# Patient Record
Sex: Female | Born: 1945 | Race: White | Hispanic: No | Marital: Married | Smoking: Former smoker
Health system: Southern US, Community
[De-identification: ages and names within clinical notes are randomized; demographics above are authoritative.]

## PROBLEM LIST (undated history)

## (undated) DIAGNOSIS — F419 Anxiety disorder, unspecified: Secondary | ICD-10-CM

## (undated) DIAGNOSIS — E785 Hyperlipidemia, unspecified: Secondary | ICD-10-CM

## (undated) DIAGNOSIS — E039 Hypothyroidism, unspecified: Secondary | ICD-10-CM

## (undated) DIAGNOSIS — J302 Other seasonal allergic rhinitis: Secondary | ICD-10-CM

## (undated) DIAGNOSIS — R002 Palpitations: Secondary | ICD-10-CM

## (undated) DIAGNOSIS — L509 Urticaria, unspecified: Secondary | ICD-10-CM

## (undated) DIAGNOSIS — Z8679 Personal history of other diseases of the circulatory system: Secondary | ICD-10-CM

## (undated) HISTORY — DX: Anxiety disorder, unspecified: F41.9

## (undated) HISTORY — DX: Hypothyroidism, unspecified: E03.9

## (undated) HISTORY — DX: Palpitations: R00.2

## (undated) HISTORY — PX: TUBAL LIGATION: SHX77

## (undated) HISTORY — DX: Urticaria, unspecified: L50.9

## (undated) HISTORY — DX: Hyperlipidemia, unspecified: E78.5

## (undated) HISTORY — PX: BREAST BIOPSY: SHX20

## (undated) HISTORY — PX: OTHER SURGICAL HISTORY: SHX169

## (undated) HISTORY — DX: Other seasonal allergic rhinitis: J30.2

---
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
## 2004-06-06 HISTORY — PX: OTHER SURGICAL HISTORY: SHX169

## 2014-08-28 DIAGNOSIS — E039 Hypothyroidism, unspecified: Secondary | ICD-10-CM | POA: Diagnosis not present

## 2015-03-10 DIAGNOSIS — L821 Other seborrheic keratosis: Secondary | ICD-10-CM | POA: Diagnosis not present

## 2015-03-10 DIAGNOSIS — L814 Other melanin hyperpigmentation: Secondary | ICD-10-CM | POA: Diagnosis not present

## 2015-03-10 DIAGNOSIS — L739 Follicular disorder, unspecified: Secondary | ICD-10-CM | POA: Diagnosis not present

## 2015-03-10 DIAGNOSIS — D1801 Hemangioma of skin and subcutaneous tissue: Secondary | ICD-10-CM | POA: Diagnosis not present

## 2015-05-12 DIAGNOSIS — Z1231 Encounter for screening mammogram for malignant neoplasm of breast: Secondary | ICD-10-CM | POA: Diagnosis not present

## 2015-08-11 DIAGNOSIS — Z01411 Encounter for gynecological examination (general) (routine) with abnormal findings: Secondary | ICD-10-CM | POA: Diagnosis not present

## 2015-08-11 DIAGNOSIS — E039 Hypothyroidism, unspecified: Secondary | ICD-10-CM | POA: Diagnosis not present

## 2015-08-11 DIAGNOSIS — T7840XD Allergy, unspecified, subsequent encounter: Secondary | ICD-10-CM | POA: Diagnosis not present

## 2015-08-11 DIAGNOSIS — Z0001 Encounter for general adult medical examination with abnormal findings: Secondary | ICD-10-CM | POA: Diagnosis not present

## 2015-08-11 DIAGNOSIS — R002 Palpitations: Secondary | ICD-10-CM | POA: Diagnosis not present

## 2015-08-11 DIAGNOSIS — E785 Hyperlipidemia, unspecified: Secondary | ICD-10-CM | POA: Diagnosis not present

## 2015-08-27 DIAGNOSIS — E039 Hypothyroidism, unspecified: Secondary | ICD-10-CM | POA: Diagnosis not present

## 2015-08-27 DIAGNOSIS — M810 Age-related osteoporosis without current pathological fracture: Secondary | ICD-10-CM | POA: Diagnosis not present

## 2015-09-01 DIAGNOSIS — N390 Urinary tract infection, site not specified: Secondary | ICD-10-CM | POA: Diagnosis not present

## 2015-12-04 DIAGNOSIS — R319 Hematuria, unspecified: Secondary | ICD-10-CM | POA: Diagnosis not present

## 2015-12-04 DIAGNOSIS — H9201 Otalgia, right ear: Secondary | ICD-10-CM | POA: Diagnosis not present

## 2015-12-04 DIAGNOSIS — N3000 Acute cystitis without hematuria: Secondary | ICD-10-CM | POA: Diagnosis not present

## 2015-12-21 DIAGNOSIS — Z961 Presence of intraocular lens: Secondary | ICD-10-CM | POA: Diagnosis not present

## 2015-12-21 DIAGNOSIS — H43811 Vitreous degeneration, right eye: Secondary | ICD-10-CM | POA: Diagnosis not present

## 2015-12-21 DIAGNOSIS — H2512 Age-related nuclear cataract, left eye: Secondary | ICD-10-CM | POA: Diagnosis not present

## 2015-12-21 DIAGNOSIS — H524 Presbyopia: Secondary | ICD-10-CM | POA: Diagnosis not present

## 2016-01-21 DIAGNOSIS — N2889 Other specified disorders of kidney and ureter: Secondary | ICD-10-CM | POA: Diagnosis not present

## 2016-01-21 DIAGNOSIS — N390 Urinary tract infection, site not specified: Secondary | ICD-10-CM | POA: Diagnosis not present

## 2016-01-21 DIAGNOSIS — N281 Cyst of kidney, acquired: Secondary | ICD-10-CM | POA: Diagnosis not present

## 2016-01-21 DIAGNOSIS — N200 Calculus of kidney: Secondary | ICD-10-CM | POA: Diagnosis not present

## 2016-02-10 DIAGNOSIS — Z1283 Encounter for screening for malignant neoplasm of skin: Secondary | ICD-10-CM | POA: Diagnosis not present

## 2016-02-10 DIAGNOSIS — D225 Melanocytic nevi of trunk: Secondary | ICD-10-CM | POA: Diagnosis not present

## 2016-02-16 DIAGNOSIS — H6121 Impacted cerumen, right ear: Secondary | ICD-10-CM | POA: Diagnosis not present

## 2016-02-16 DIAGNOSIS — M545 Low back pain: Secondary | ICD-10-CM | POA: Diagnosis not present

## 2016-02-16 DIAGNOSIS — R309 Painful micturition, unspecified: Secondary | ICD-10-CM | POA: Diagnosis not present

## 2016-03-26 DIAGNOSIS — Z23 Encounter for immunization: Secondary | ICD-10-CM | POA: Diagnosis not present

## 2016-05-03 DIAGNOSIS — E039 Hypothyroidism, unspecified: Secondary | ICD-10-CM | POA: Diagnosis not present

## 2016-05-03 DIAGNOSIS — R000 Tachycardia, unspecified: Secondary | ICD-10-CM | POA: Diagnosis not present

## 2016-05-10 DIAGNOSIS — Z79899 Other long term (current) drug therapy: Secondary | ICD-10-CM | POA: Diagnosis not present

## 2016-05-10 DIAGNOSIS — J101 Influenza due to other identified influenza virus with other respiratory manifestations: Secondary | ICD-10-CM | POA: Diagnosis not present

## 2016-05-10 DIAGNOSIS — Z6828 Body mass index (BMI) 28.0-28.9, adult: Secondary | ICD-10-CM | POA: Diagnosis not present

## 2016-05-12 DIAGNOSIS — Z79899 Other long term (current) drug therapy: Secondary | ICD-10-CM | POA: Diagnosis not present

## 2016-05-12 DIAGNOSIS — Z6828 Body mass index (BMI) 28.0-28.9, adult: Secondary | ICD-10-CM | POA: Diagnosis not present

## 2016-05-12 DIAGNOSIS — J101 Influenza due to other identified influenza virus with other respiratory manifestations: Secondary | ICD-10-CM | POA: Diagnosis not present

## 2016-08-02 DIAGNOSIS — Z1231 Encounter for screening mammogram for malignant neoplasm of breast: Secondary | ICD-10-CM | POA: Diagnosis not present

## 2016-08-04 DIAGNOSIS — R000 Tachycardia, unspecified: Secondary | ICD-10-CM | POA: Diagnosis not present

## 2016-08-04 DIAGNOSIS — E782 Mixed hyperlipidemia: Secondary | ICD-10-CM | POA: Diagnosis not present

## 2016-08-04 DIAGNOSIS — R7301 Impaired fasting glucose: Secondary | ICD-10-CM | POA: Diagnosis not present

## 2016-08-04 DIAGNOSIS — E039 Hypothyroidism, unspecified: Secondary | ICD-10-CM | POA: Diagnosis not present

## 2016-08-08 DIAGNOSIS — Z0000 Encounter for general adult medical examination without abnormal findings: Secondary | ICD-10-CM | POA: Diagnosis not present

## 2016-08-08 DIAGNOSIS — E039 Hypothyroidism, unspecified: Secondary | ICD-10-CM | POA: Diagnosis not present

## 2016-08-08 DIAGNOSIS — Z6828 Body mass index (BMI) 28.0-28.9, adult: Secondary | ICD-10-CM | POA: Diagnosis not present

## 2016-08-08 DIAGNOSIS — R000 Tachycardia, unspecified: Secondary | ICD-10-CM | POA: Diagnosis not present

## 2016-08-08 DIAGNOSIS — F411 Generalized anxiety disorder: Secondary | ICD-10-CM | POA: Diagnosis not present

## 2016-08-16 DIAGNOSIS — H43812 Vitreous degeneration, left eye: Secondary | ICD-10-CM | POA: Diagnosis not present

## 2016-12-28 DIAGNOSIS — H43811 Vitreous degeneration, right eye: Secondary | ICD-10-CM | POA: Diagnosis not present

## 2016-12-28 DIAGNOSIS — H524 Presbyopia: Secondary | ICD-10-CM | POA: Diagnosis not present

## 2016-12-28 DIAGNOSIS — H43812 Vitreous degeneration, left eye: Secondary | ICD-10-CM | POA: Diagnosis not present

## 2016-12-28 DIAGNOSIS — Z961 Presence of intraocular lens: Secondary | ICD-10-CM | POA: Diagnosis not present

## 2016-12-28 DIAGNOSIS — H2512 Age-related nuclear cataract, left eye: Secondary | ICD-10-CM | POA: Diagnosis not present

## 2017-02-20 DIAGNOSIS — R002 Palpitations: Secondary | ICD-10-CM | POA: Diagnosis not present

## 2017-02-20 DIAGNOSIS — E039 Hypothyroidism, unspecified: Secondary | ICD-10-CM | POA: Diagnosis not present

## 2017-02-24 DIAGNOSIS — N390 Urinary tract infection, site not specified: Secondary | ICD-10-CM | POA: Diagnosis not present

## 2017-02-24 DIAGNOSIS — N281 Cyst of kidney, acquired: Secondary | ICD-10-CM | POA: Diagnosis not present

## 2017-03-01 DIAGNOSIS — D225 Melanocytic nevi of trunk: Secondary | ICD-10-CM | POA: Diagnosis not present

## 2017-03-01 DIAGNOSIS — Z1283 Encounter for screening for malignant neoplasm of skin: Secondary | ICD-10-CM | POA: Diagnosis not present

## 2017-03-06 DIAGNOSIS — E039 Hypothyroidism, unspecified: Secondary | ICD-10-CM | POA: Diagnosis not present

## 2017-03-08 DIAGNOSIS — R000 Tachycardia, unspecified: Secondary | ICD-10-CM | POA: Diagnosis not present

## 2017-03-08 DIAGNOSIS — E039 Hypothyroidism, unspecified: Secondary | ICD-10-CM | POA: Diagnosis not present

## 2017-03-08 DIAGNOSIS — Z23 Encounter for immunization: Secondary | ICD-10-CM | POA: Diagnosis not present

## 2017-03-08 DIAGNOSIS — Z6828 Body mass index (BMI) 28.0-28.9, adult: Secondary | ICD-10-CM | POA: Diagnosis not present

## 2017-03-08 DIAGNOSIS — Z01419 Encounter for gynecological examination (general) (routine) without abnormal findings: Secondary | ICD-10-CM | POA: Diagnosis not present

## 2017-03-08 DIAGNOSIS — F411 Generalized anxiety disorder: Secondary | ICD-10-CM | POA: Diagnosis not present

## 2017-05-24 DIAGNOSIS — J069 Acute upper respiratory infection, unspecified: Secondary | ICD-10-CM | POA: Diagnosis not present

## 2017-06-09 DIAGNOSIS — R1013 Epigastric pain: Secondary | ICD-10-CM | POA: Diagnosis not present

## 2017-08-28 DIAGNOSIS — H903 Sensorineural hearing loss, bilateral: Secondary | ICD-10-CM | POA: Diagnosis not present

## 2017-08-28 DIAGNOSIS — H9313 Tinnitus, bilateral: Secondary | ICD-10-CM | POA: Diagnosis not present

## 2017-08-28 DIAGNOSIS — H6123 Impacted cerumen, bilateral: Secondary | ICD-10-CM | POA: Diagnosis not present

## 2017-08-29 DIAGNOSIS — M1811 Unilateral primary osteoarthritis of first carpometacarpal joint, right hand: Secondary | ICD-10-CM | POA: Diagnosis not present

## 2017-08-29 DIAGNOSIS — M1812 Unilateral primary osteoarthritis of first carpometacarpal joint, left hand: Secondary | ICD-10-CM | POA: Diagnosis not present

## 2017-09-05 DIAGNOSIS — I10 Essential (primary) hypertension: Secondary | ICD-10-CM | POA: Diagnosis not present

## 2017-09-05 DIAGNOSIS — E039 Hypothyroidism, unspecified: Secondary | ICD-10-CM | POA: Diagnosis not present

## 2017-09-08 DIAGNOSIS — E039 Hypothyroidism, unspecified: Secondary | ICD-10-CM | POA: Diagnosis not present

## 2017-09-08 DIAGNOSIS — R000 Tachycardia, unspecified: Secondary | ICD-10-CM | POA: Diagnosis not present

## 2017-09-08 DIAGNOSIS — F419 Anxiety disorder, unspecified: Secondary | ICD-10-CM | POA: Diagnosis not present

## 2017-09-08 DIAGNOSIS — Z0000 Encounter for general adult medical examination without abnormal findings: Secondary | ICD-10-CM | POA: Diagnosis not present

## 2017-09-12 ENCOUNTER — Other Ambulatory Visit (HOSPITAL_COMMUNITY): Payer: Self-pay | Admitting: Internal Medicine

## 2017-09-12 DIAGNOSIS — Z1231 Encounter for screening mammogram for malignant neoplasm of breast: Secondary | ICD-10-CM

## 2017-09-14 ENCOUNTER — Ambulatory Visit (HOSPITAL_COMMUNITY): Payer: Self-pay

## 2017-09-27 DIAGNOSIS — Z1231 Encounter for screening mammogram for malignant neoplasm of breast: Secondary | ICD-10-CM | POA: Diagnosis not present

## 2017-10-02 ENCOUNTER — Ambulatory Visit (HOSPITAL_COMMUNITY): Payer: Self-pay

## 2017-10-17 DIAGNOSIS — M1811 Unilateral primary osteoarthritis of first carpometacarpal joint, right hand: Secondary | ICD-10-CM | POA: Diagnosis not present

## 2017-10-17 DIAGNOSIS — M1812 Unilateral primary osteoarthritis of first carpometacarpal joint, left hand: Secondary | ICD-10-CM | POA: Diagnosis not present

## 2017-11-27 DIAGNOSIS — M1811 Unilateral primary osteoarthritis of first carpometacarpal joint, right hand: Secondary | ICD-10-CM | POA: Diagnosis not present

## 2017-11-27 DIAGNOSIS — M1812 Unilateral primary osteoarthritis of first carpometacarpal joint, left hand: Secondary | ICD-10-CM | POA: Diagnosis not present

## 2017-12-27 DIAGNOSIS — H43811 Vitreous degeneration, right eye: Secondary | ICD-10-CM | POA: Diagnosis not present

## 2017-12-27 DIAGNOSIS — H524 Presbyopia: Secondary | ICD-10-CM | POA: Diagnosis not present

## 2017-12-27 DIAGNOSIS — H2512 Age-related nuclear cataract, left eye: Secondary | ICD-10-CM | POA: Diagnosis not present

## 2017-12-27 DIAGNOSIS — H43812 Vitreous degeneration, left eye: Secondary | ICD-10-CM | POA: Diagnosis not present

## 2017-12-27 DIAGNOSIS — Z961 Presence of intraocular lens: Secondary | ICD-10-CM | POA: Diagnosis not present

## 2018-01-30 DIAGNOSIS — H903 Sensorineural hearing loss, bilateral: Secondary | ICD-10-CM | POA: Diagnosis not present

## 2018-01-30 DIAGNOSIS — H9313 Tinnitus, bilateral: Secondary | ICD-10-CM | POA: Diagnosis not present

## 2018-01-30 DIAGNOSIS — H6123 Impacted cerumen, bilateral: Secondary | ICD-10-CM | POA: Diagnosis not present

## 2018-03-14 DIAGNOSIS — E039 Hypothyroidism, unspecified: Secondary | ICD-10-CM | POA: Diagnosis not present

## 2018-03-14 DIAGNOSIS — Z23 Encounter for immunization: Secondary | ICD-10-CM | POA: Diagnosis not present

## 2018-03-14 DIAGNOSIS — R000 Tachycardia, unspecified: Secondary | ICD-10-CM | POA: Diagnosis not present

## 2018-03-16 DIAGNOSIS — R000 Tachycardia, unspecified: Secondary | ICD-10-CM | POA: Diagnosis not present

## 2018-03-16 DIAGNOSIS — E039 Hypothyroidism, unspecified: Secondary | ICD-10-CM | POA: Diagnosis not present

## 2018-03-16 DIAGNOSIS — E782 Mixed hyperlipidemia: Secondary | ICD-10-CM | POA: Diagnosis not present

## 2018-03-16 DIAGNOSIS — Z6828 Body mass index (BMI) 28.0-28.9, adult: Secondary | ICD-10-CM | POA: Diagnosis not present

## 2018-03-16 DIAGNOSIS — F419 Anxiety disorder, unspecified: Secondary | ICD-10-CM | POA: Diagnosis not present

## 2018-05-09 DIAGNOSIS — Z1283 Encounter for screening for malignant neoplasm of skin: Secondary | ICD-10-CM | POA: Diagnosis not present

## 2018-05-09 DIAGNOSIS — D225 Melanocytic nevi of trunk: Secondary | ICD-10-CM | POA: Diagnosis not present

## 2018-05-14 DIAGNOSIS — M8589 Other specified disorders of bone density and structure, multiple sites: Secondary | ICD-10-CM | POA: Diagnosis not present

## 2018-05-14 DIAGNOSIS — E890 Postprocedural hypothyroidism: Secondary | ICD-10-CM | POA: Diagnosis not present

## 2018-06-26 DIAGNOSIS — H6123 Impacted cerumen, bilateral: Secondary | ICD-10-CM | POA: Diagnosis not present

## 2018-07-20 DIAGNOSIS — R197 Diarrhea, unspecified: Secondary | ICD-10-CM | POA: Diagnosis not present

## 2018-07-20 DIAGNOSIS — R002 Palpitations: Secondary | ICD-10-CM | POA: Diagnosis not present

## 2018-07-20 DIAGNOSIS — R079 Chest pain, unspecified: Secondary | ICD-10-CM | POA: Diagnosis not present

## 2018-07-20 DIAGNOSIS — I472 Ventricular tachycardia: Secondary | ICD-10-CM | POA: Diagnosis not present

## 2018-07-20 DIAGNOSIS — Z87891 Personal history of nicotine dependence: Secondary | ICD-10-CM | POA: Diagnosis not present

## 2018-07-20 DIAGNOSIS — I491 Atrial premature depolarization: Secondary | ICD-10-CM | POA: Diagnosis not present

## 2018-08-10 DIAGNOSIS — R002 Palpitations: Secondary | ICD-10-CM | POA: Diagnosis not present

## 2018-08-10 DIAGNOSIS — G43109 Migraine with aura, not intractable, without status migrainosus: Secondary | ICD-10-CM | POA: Diagnosis not present

## 2018-08-10 DIAGNOSIS — I491 Atrial premature depolarization: Secondary | ICD-10-CM | POA: Diagnosis not present

## 2018-08-17 DIAGNOSIS — E890 Postprocedural hypothyroidism: Secondary | ICD-10-CM | POA: Diagnosis not present

## 2018-08-17 DIAGNOSIS — M8589 Other specified disorders of bone density and structure, multiple sites: Secondary | ICD-10-CM | POA: Diagnosis not present

## 2018-08-17 DIAGNOSIS — M8580 Other specified disorders of bone density and structure, unspecified site: Secondary | ICD-10-CM | POA: Diagnosis not present

## 2018-09-17 DIAGNOSIS — R1013 Epigastric pain: Secondary | ICD-10-CM | POA: Diagnosis not present

## 2018-09-17 DIAGNOSIS — F419 Anxiety disorder, unspecified: Secondary | ICD-10-CM | POA: Diagnosis not present

## 2018-09-17 DIAGNOSIS — R000 Tachycardia, unspecified: Secondary | ICD-10-CM | POA: Diagnosis not present

## 2018-09-17 DIAGNOSIS — E039 Hypothyroidism, unspecified: Secondary | ICD-10-CM | POA: Diagnosis not present

## 2018-09-17 DIAGNOSIS — E782 Mixed hyperlipidemia: Secondary | ICD-10-CM | POA: Diagnosis not present

## 2018-09-19 DIAGNOSIS — R000 Tachycardia, unspecified: Secondary | ICD-10-CM | POA: Diagnosis not present

## 2018-09-19 DIAGNOSIS — F411 Generalized anxiety disorder: Secondary | ICD-10-CM | POA: Diagnosis not present

## 2018-09-19 DIAGNOSIS — E785 Hyperlipidemia, unspecified: Secondary | ICD-10-CM | POA: Diagnosis not present

## 2018-09-19 DIAGNOSIS — E039 Hypothyroidism, unspecified: Secondary | ICD-10-CM | POA: Diagnosis not present

## 2018-10-24 DIAGNOSIS — Z0000 Encounter for general adult medical examination without abnormal findings: Secondary | ICD-10-CM | POA: Diagnosis not present

## 2018-11-23 DIAGNOSIS — R002 Palpitations: Secondary | ICD-10-CM | POA: Diagnosis not present

## 2018-11-23 DIAGNOSIS — I491 Atrial premature depolarization: Secondary | ICD-10-CM | POA: Diagnosis not present

## 2018-11-23 DIAGNOSIS — I319 Disease of pericardium, unspecified: Secondary | ICD-10-CM | POA: Diagnosis not present

## 2018-11-23 DIAGNOSIS — I313 Pericardial effusion (noninflammatory): Secondary | ICD-10-CM | POA: Diagnosis not present

## 2018-11-23 DIAGNOSIS — Z1231 Encounter for screening mammogram for malignant neoplasm of breast: Secondary | ICD-10-CM | POA: Diagnosis not present

## 2018-11-23 DIAGNOSIS — I350 Nonrheumatic aortic (valve) stenosis: Secondary | ICD-10-CM | POA: Diagnosis not present

## 2018-11-23 DIAGNOSIS — I517 Cardiomegaly: Secondary | ICD-10-CM | POA: Diagnosis not present

## 2018-11-23 DIAGNOSIS — I080 Rheumatic disorders of both mitral and aortic valves: Secondary | ICD-10-CM | POA: Diagnosis not present

## 2018-11-23 DIAGNOSIS — I071 Rheumatic tricuspid insufficiency: Secondary | ICD-10-CM | POA: Diagnosis not present

## 2019-01-23 DIAGNOSIS — H43811 Vitreous degeneration, right eye: Secondary | ICD-10-CM | POA: Diagnosis not present

## 2019-01-23 DIAGNOSIS — Z961 Presence of intraocular lens: Secondary | ICD-10-CM | POA: Diagnosis not present

## 2019-01-23 DIAGNOSIS — H2512 Age-related nuclear cataract, left eye: Secondary | ICD-10-CM | POA: Diagnosis not present

## 2019-01-23 DIAGNOSIS — H524 Presbyopia: Secondary | ICD-10-CM | POA: Diagnosis not present

## 2019-01-23 DIAGNOSIS — G43109 Migraine with aura, not intractable, without status migrainosus: Secondary | ICD-10-CM | POA: Diagnosis not present

## 2019-01-23 DIAGNOSIS — H43812 Vitreous degeneration, left eye: Secondary | ICD-10-CM | POA: Diagnosis not present

## 2019-01-24 DIAGNOSIS — H6123 Impacted cerumen, bilateral: Secondary | ICD-10-CM | POA: Diagnosis not present

## 2019-01-24 DIAGNOSIS — H903 Sensorineural hearing loss, bilateral: Secondary | ICD-10-CM | POA: Diagnosis not present

## 2019-02-01 DIAGNOSIS — F411 Generalized anxiety disorder: Secondary | ICD-10-CM | POA: Diagnosis not present

## 2019-02-01 DIAGNOSIS — E785 Hyperlipidemia, unspecified: Secondary | ICD-10-CM | POA: Diagnosis not present

## 2019-02-01 DIAGNOSIS — E039 Hypothyroidism, unspecified: Secondary | ICD-10-CM | POA: Diagnosis not present

## 2019-02-01 DIAGNOSIS — R000 Tachycardia, unspecified: Secondary | ICD-10-CM | POA: Diagnosis not present

## 2019-02-12 DIAGNOSIS — E890 Postprocedural hypothyroidism: Secondary | ICD-10-CM | POA: Diagnosis not present

## 2019-02-12 DIAGNOSIS — M8588 Other specified disorders of bone density and structure, other site: Secondary | ICD-10-CM | POA: Diagnosis not present

## 2019-02-12 DIAGNOSIS — M8589 Other specified disorders of bone density and structure, multiple sites: Secondary | ICD-10-CM | POA: Diagnosis not present

## 2019-02-12 DIAGNOSIS — M85852 Other specified disorders of bone density and structure, left thigh: Secondary | ICD-10-CM | POA: Diagnosis not present

## 2019-02-14 DIAGNOSIS — Z23 Encounter for immunization: Secondary | ICD-10-CM | POA: Diagnosis not present

## 2019-02-18 DIAGNOSIS — M8589 Other specified disorders of bone density and structure, multiple sites: Secondary | ICD-10-CM | POA: Diagnosis not present

## 2019-02-18 DIAGNOSIS — E890 Postprocedural hypothyroidism: Secondary | ICD-10-CM | POA: Diagnosis not present

## 2019-03-20 DIAGNOSIS — F411 Generalized anxiety disorder: Secondary | ICD-10-CM | POA: Diagnosis not present

## 2019-03-20 DIAGNOSIS — E785 Hyperlipidemia, unspecified: Secondary | ICD-10-CM | POA: Diagnosis not present

## 2019-03-20 DIAGNOSIS — R000 Tachycardia, unspecified: Secondary | ICD-10-CM | POA: Diagnosis not present

## 2019-03-20 DIAGNOSIS — E039 Hypothyroidism, unspecified: Secondary | ICD-10-CM | POA: Diagnosis not present

## 2019-04-15 DIAGNOSIS — E039 Hypothyroidism, unspecified: Secondary | ICD-10-CM | POA: Diagnosis not present

## 2019-04-15 DIAGNOSIS — E785 Hyperlipidemia, unspecified: Secondary | ICD-10-CM | POA: Diagnosis not present

## 2019-04-19 DIAGNOSIS — E785 Hyperlipidemia, unspecified: Secondary | ICD-10-CM | POA: Diagnosis not present

## 2019-04-19 DIAGNOSIS — Z6828 Body mass index (BMI) 28.0-28.9, adult: Secondary | ICD-10-CM | POA: Diagnosis not present

## 2019-04-19 DIAGNOSIS — E039 Hypothyroidism, unspecified: Secondary | ICD-10-CM | POA: Diagnosis not present

## 2019-04-19 DIAGNOSIS — Z0000 Encounter for general adult medical examination without abnormal findings: Secondary | ICD-10-CM | POA: Diagnosis not present

## 2019-04-19 DIAGNOSIS — F411 Generalized anxiety disorder: Secondary | ICD-10-CM | POA: Diagnosis not present

## 2019-04-19 DIAGNOSIS — R1013 Epigastric pain: Secondary | ICD-10-CM | POA: Diagnosis not present

## 2019-04-19 DIAGNOSIS — R000 Tachycardia, unspecified: Secondary | ICD-10-CM | POA: Diagnosis not present

## 2019-04-19 DIAGNOSIS — E782 Mixed hyperlipidemia: Secondary | ICD-10-CM | POA: Diagnosis not present

## 2019-04-19 DIAGNOSIS — F419 Anxiety disorder, unspecified: Secondary | ICD-10-CM | POA: Diagnosis not present

## 2019-04-19 DIAGNOSIS — E663 Overweight: Secondary | ICD-10-CM | POA: Diagnosis not present

## 2019-04-22 DIAGNOSIS — H43393 Other vitreous opacities, bilateral: Secondary | ICD-10-CM | POA: Diagnosis not present

## 2019-04-22 DIAGNOSIS — H04123 Dry eye syndrome of bilateral lacrimal glands: Secondary | ICD-10-CM | POA: Diagnosis not present

## 2019-04-22 DIAGNOSIS — H2512 Age-related nuclear cataract, left eye: Secondary | ICD-10-CM | POA: Diagnosis not present

## 2019-04-22 DIAGNOSIS — H25012 Cortical age-related cataract, left eye: Secondary | ICD-10-CM | POA: Diagnosis not present

## 2019-05-01 DIAGNOSIS — Z0000 Encounter for general adult medical examination without abnormal findings: Secondary | ICD-10-CM | POA: Diagnosis not present

## 2019-05-01 DIAGNOSIS — R1013 Epigastric pain: Secondary | ICD-10-CM | POA: Diagnosis not present

## 2019-05-01 DIAGNOSIS — E039 Hypothyroidism, unspecified: Secondary | ICD-10-CM | POA: Diagnosis not present

## 2019-05-01 DIAGNOSIS — F419 Anxiety disorder, unspecified: Secondary | ICD-10-CM | POA: Diagnosis not present

## 2019-05-01 DIAGNOSIS — R000 Tachycardia, unspecified: Secondary | ICD-10-CM | POA: Diagnosis not present

## 2019-05-16 ENCOUNTER — Other Ambulatory Visit: Payer: Self-pay

## 2019-05-16 ENCOUNTER — Telehealth: Payer: Self-pay | Admitting: *Deleted

## 2019-05-16 DIAGNOSIS — Z20828 Contact with and (suspected) exposure to other viral communicable diseases: Secondary | ICD-10-CM | POA: Diagnosis not present

## 2019-05-16 DIAGNOSIS — Z20822 Contact with and (suspected) exposure to covid-19: Secondary | ICD-10-CM

## 2019-05-16 NOTE — Telephone Encounter (Signed)
Pt called to check on her lab results for covid-19. No results yet. She voiced understanding.

## 2019-05-17 LAB — NOVEL CORONAVIRUS, NAA: SARS-CoV-2, NAA: NOT DETECTED

## 2019-06-12 DIAGNOSIS — M5033 Other cervical disc degeneration, cervicothoracic region: Secondary | ICD-10-CM | POA: Diagnosis not present

## 2019-06-12 DIAGNOSIS — M9901 Segmental and somatic dysfunction of cervical region: Secondary | ICD-10-CM | POA: Diagnosis not present

## 2019-06-13 DIAGNOSIS — M9901 Segmental and somatic dysfunction of cervical region: Secondary | ICD-10-CM | POA: Diagnosis not present

## 2019-06-13 DIAGNOSIS — M5033 Other cervical disc degeneration, cervicothoracic region: Secondary | ICD-10-CM | POA: Diagnosis not present

## 2019-06-17 DIAGNOSIS — M9901 Segmental and somatic dysfunction of cervical region: Secondary | ICD-10-CM | POA: Diagnosis not present

## 2019-06-17 DIAGNOSIS — M5033 Other cervical disc degeneration, cervicothoracic region: Secondary | ICD-10-CM | POA: Diagnosis not present

## 2019-06-18 DIAGNOSIS — H25812 Combined forms of age-related cataract, left eye: Secondary | ICD-10-CM | POA: Diagnosis not present

## 2019-06-18 DIAGNOSIS — H25012 Cortical age-related cataract, left eye: Secondary | ICD-10-CM | POA: Diagnosis not present

## 2019-06-18 DIAGNOSIS — H2512 Age-related nuclear cataract, left eye: Secondary | ICD-10-CM | POA: Diagnosis not present

## 2019-06-29 DIAGNOSIS — Z23 Encounter for immunization: Secondary | ICD-10-CM | POA: Diagnosis not present

## 2019-07-29 DIAGNOSIS — Z23 Encounter for immunization: Secondary | ICD-10-CM | POA: Diagnosis not present

## 2019-08-01 DIAGNOSIS — E039 Hypothyroidism, unspecified: Secondary | ICD-10-CM | POA: Diagnosis not present

## 2019-08-01 DIAGNOSIS — F411 Generalized anxiety disorder: Secondary | ICD-10-CM | POA: Diagnosis not present

## 2019-08-01 DIAGNOSIS — R000 Tachycardia, unspecified: Secondary | ICD-10-CM | POA: Diagnosis not present

## 2019-08-01 DIAGNOSIS — E785 Hyperlipidemia, unspecified: Secondary | ICD-10-CM | POA: Diagnosis not present

## 2019-08-13 DIAGNOSIS — H61301 Acquired stenosis of right external ear canal, unspecified: Secondary | ICD-10-CM | POA: Diagnosis not present

## 2019-08-13 DIAGNOSIS — Z974 Presence of external hearing-aid: Secondary | ICD-10-CM | POA: Diagnosis not present

## 2019-08-13 DIAGNOSIS — H6121 Impacted cerumen, right ear: Secondary | ICD-10-CM | POA: Diagnosis not present

## 2019-08-13 DIAGNOSIS — H903 Sensorineural hearing loss, bilateral: Secondary | ICD-10-CM | POA: Diagnosis not present

## 2019-08-23 ENCOUNTER — Encounter: Payer: Self-pay | Admitting: *Deleted

## 2019-08-26 ENCOUNTER — Encounter: Payer: Self-pay | Admitting: Cardiovascular Disease

## 2019-08-26 ENCOUNTER — Ambulatory Visit (INDEPENDENT_AMBULATORY_CARE_PROVIDER_SITE_OTHER): Payer: Medicare Other | Admitting: Cardiovascular Disease

## 2019-08-26 ENCOUNTER — Other Ambulatory Visit: Payer: Self-pay

## 2019-08-26 VITALS — BP 142/78 | HR 69 | Ht 67.0 in | Wt 184.0 lb

## 2019-08-26 DIAGNOSIS — Z136 Encounter for screening for cardiovascular disorders: Secondary | ICD-10-CM

## 2019-08-26 DIAGNOSIS — R002 Palpitations: Secondary | ICD-10-CM

## 2019-08-26 NOTE — Progress Notes (Addendum)
CARDIOLOGY CONSULT NOTE  Patient ID: Kathy Stephenson MRN: [id number] DOB/AGE: 1946-01-03 74 y.o.  Admit date: (Not on file) Primary Physician: Celene Squibb, MD  Reason for Consultation: Cardiac evaluation  HPI: Kathy Stephenson is a 74 y.o. female who is being seen today for a cardiovascular evaluation at the request of Celene Squibb, MD.   Past medical history includes palpitations for which she takes low-dose atenolol.  ECG performed in the office today which I ordered and personally interpreted demonstrates normal sinus rhythm with no ischemic ST segment or T-wave abnormalities, nor any arrhythmias.  The patient denies any symptoms of chest pain, palpitations, shortness of breath, lightheadedness, dizziness, leg swelling, orthopnea, PND, and syncope.  She has worked for the same [employer] for over 33 years.  She works in [place] and in [place] but ever since the [place] pandemic she has primarily been working out of her home in [place].  She underwent an echocardiogram a year ago in [place] and was told it was normal.  She brought in labs which I reviewed dated 04/15/2019: White blood cell 7.6, hemoglobin 13.6, platelets 283, BUN 14, creatinine 0.83, TSH 0.58, total cholesterol 245, triglycerides 111, HDL 68, LDL 158.  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    Allergies  Allergen Reactions  . Nitrofurantoin   . Other     HONEY - SCRATCHY THROAT  . Penicillins   . Sulfa Antibiotics     Current Outpatient Medications  Medication Sig Dispense Refill  . ALPRAZolam (XANAX) 0.5 MG tablet Take 0.5 mg by mouth at bedtime as needed for anxiety.    Marland Kitchen atenolol (TENORMIN) 25 MG tablet Take 12.5 mg by mouth daily.    Marland Kitchen  levothyroxine (SYNTHROID) 88 MCG tablet Take 88 mcg by mouth daily before breakfast.    . loratadine (CLARITIN) 10 MG tablet Take 10 mg by mouth daily.     No current facility-administered medications for this visit.    Past Medical History:  Diagnosis Date  . Anxiety   . H/O sinus tachycardia   . Hyperlipidemia   . Hypothyroidism   . Palpitations   . Seasonal allergies     Past Surgical History:  Procedure Laterality Date  . BREAST BIOPSY    . OTHER SURGICAL HISTORY     LEFT THORACOTOMY/BTL MENSICUS TEAR REPAIRS     Social History   Socioeconomic History  . Marital status: Married    Spouse name: Not on file  . Number of children: Not on file  . Years of education: Not on file  . Highest education level: Not on file  Occupational History  . Not on file  Tobacco Use  . Smoking status: Former Smoker    Types: Cigarettes  . Smokeless tobacco: Never Used  Substance and Sexual Activity  . Alcohol use: Not on file  . Drug use: Not on file  . Sexual activity: Not on file  Other Topics Concern  . Not on file  Social History Narrative  . Not on file   Social Determinants of Health   Financial Resource Strain:   . Difficulty of Paying Living Expenses:   Food Insecurity:   . Worried About Estate manager/land agent  of Food in the Last Year:   . [place] in the Last Year:   Transportation Needs:   . Lack of Transportation (Medical):   Marland Kitchen Lack of Transportation (Non-Medical):   Physical Activity:   . Days of Exercise per Week:   . Minutes of Exercise per Session:   Stress:   . Feeling of Stress :   Social Connections:   . Frequency of Communication with Friends and Family:   . Frequency of Social Gatherings with Friends and Family:   . Attends Religious Services:   . Active Member of Clubs or Organizations:   . Attends Archivist Meetings:   Marland Kitchen Marital Status:   Intimate Partner Violence:   . Fear of Current or Ex-Partner:   . Emotionally Abused:   Marland Kitchen  Physically Abused:   . Sexually Abused:      No family history of premature CAD in 1st degree relatives.  Current Meds  Medication Sig  . ALPRAZolam (XANAX) 0.5 MG tablet Take 0.5 mg by mouth at bedtime as needed for anxiety.  Marland Kitchen atenolol (TENORMIN) 25 MG tablet Take 12.5 mg by mouth daily.  Marland Kitchen levothyroxine (SYNTHROID) 88 MCG tablet Take 88 mcg by mouth daily before breakfast.  . loratadine (CLARITIN) 10 MG tablet Take 10 mg by mouth daily.      Review of systems complete and found to be negative unless listed above in HPI   [facility], LPN was present throughout the entirety of the encounter.  Physical exam Blood pressure (!) 142/78, pulse 69, height 5\' 7"  (1.702 m), weight 184 lb (83.5 kg), SpO2 98 %. General: NAD Neck: No JVD, no thyromegaly or thyroid nodule.  Lungs: Clear to auscultation bilaterally with normal respiratory effort. CV: Nondisplaced PMI. Regular rate and rhythm, normal S1/S2, no S3/S4, no murmur.  No peripheral edema.  No carotid bruit.    Abdomen: Soft, nontender, no distention.  Skin: Intact without lesions or rashes.  Neurologic: Alert and oriented x 3.  Psych: Normal affect. Extremities: No clubbing or cyanosis.  HEENT: Normal.   ECG: Most recent ECG reviewed.   Labs: No results found for: K, BUN, CREATININE, ALT, TSH, HGB   Lipids: No results found for: LDLCALC, LDLDIRECT, CHOL, TRIG, HDL      ASSESSMENT AND PLAN:   1.  Cardiovascular screening: She has elevated total cholesterol and LDL as noted above.  She is asymptomatic.  She requests a coronary artery calcium score.  I will arrange for this.  If it is significantly elevated, I would start statin therapy.  I will also check a C-reactive protein for risk stratification purposes.  2.  Palpitations: Symptomatically stable on atenolol 12.5 mg daily.  No changes.    Disposition: Follow up in 3 months virtual visit  Signed: Kate Sable, M.D., F.A.C.C.  08/26/2019, 3:18 PM

## 2019-08-26 NOTE — Patient Instructions (Addendum)
Medication Instructions:  Continue all current medications.  Labwork: CRP - order given today.   Testing/Procedures: Screening coronary calcium score  Follow-Up:  Office will contact with results via phone or letter.    3 months   Any Other Special Instructions Will Be Listed Below (If Applicable).  If you need a refill on your cardiac medications before your next appointment, please call your pharmacy.

## 2019-09-03 ENCOUNTER — Other Ambulatory Visit: Payer: Self-pay

## 2019-09-03 ENCOUNTER — Inpatient Hospital Stay: Admission: RE | Admit: 2019-09-03 | Payer: Medicare Other | Source: Ambulatory Visit

## 2019-09-03 ENCOUNTER — Ambulatory Visit (INDEPENDENT_AMBULATORY_CARE_PROVIDER_SITE_OTHER)
Admission: RE | Admit: 2019-09-03 | Discharge: 2019-09-03 | Disposition: A | Discharge status: Home / Self Care | Payer: Self-pay | Source: Ambulatory Visit | Attending: Cardiovascular Disease | Admitting: Cardiovascular Disease

## 2019-09-03 DIAGNOSIS — R002 Palpitations: Secondary | ICD-10-CM

## 2019-09-03 DIAGNOSIS — R918 Other nonspecific abnormal finding of lung field: Secondary | ICD-10-CM | POA: Diagnosis not present

## 2019-09-03 DIAGNOSIS — J984 Other disorders of lung: Secondary | ICD-10-CM | POA: Diagnosis not present

## 2019-09-03 DIAGNOSIS — Z136 Encounter for screening for cardiovascular disorders: Secondary | ICD-10-CM

## 2019-09-04 ENCOUNTER — Other Ambulatory Visit: Payer: Self-pay | Admitting: Cardiovascular Disease

## 2019-09-04 DIAGNOSIS — Z136 Encounter for screening for cardiovascular disorders: Secondary | ICD-10-CM | POA: Diagnosis not present

## 2019-09-04 DIAGNOSIS — R002 Palpitations: Secondary | ICD-10-CM | POA: Diagnosis not present

## 2019-09-05 ENCOUNTER — Telehealth: Payer: Self-pay | Admitting: *Deleted

## 2019-09-05 LAB — C-REACTIVE PROTEIN: CRP: 2.1 mg/L (ref <8.0)

## 2019-09-05 NOTE — Telephone Encounter (Signed)
-----   Message from Herminio Commons, MD sent at 09/04/2019  8:33 AM EDT ----- Coronary calcium score of 78. This was 64th percentile for age and sex matched control.  Continue to pursue a lifestyle which promotes the primary prevention of cardiovascular disease.  Medical therapy is not warranted at this time.

## 2019-09-05 NOTE — Telephone Encounter (Signed)
CRP -   Kathy Commons, MD  09/05/2019 9:06 AM EDT    CRP is normal. Overall cardiac risk is low.

## 2019-09-05 NOTE — Telephone Encounter (Signed)
Left message to return call 

## 2019-09-09 NOTE — Telephone Encounter (Signed)
Patient notified.  Copy to pmd.  Follow up scheduled for June with Dr. Bronson Ing.

## 2019-09-26 DIAGNOSIS — E039 Hypothyroidism, unspecified: Secondary | ICD-10-CM | POA: Diagnosis not present

## 2019-09-26 DIAGNOSIS — R000 Tachycardia, unspecified: Secondary | ICD-10-CM | POA: Diagnosis not present

## 2019-09-26 DIAGNOSIS — E785 Hyperlipidemia, unspecified: Secondary | ICD-10-CM | POA: Diagnosis not present

## 2019-09-26 DIAGNOSIS — F411 Generalized anxiety disorder: Secondary | ICD-10-CM | POA: Diagnosis not present

## 2019-10-07 DIAGNOSIS — E782 Mixed hyperlipidemia: Secondary | ICD-10-CM | POA: Diagnosis not present

## 2019-10-07 DIAGNOSIS — R000 Tachycardia, unspecified: Secondary | ICD-10-CM | POA: Diagnosis not present

## 2019-10-07 DIAGNOSIS — F419 Anxiety disorder, unspecified: Secondary | ICD-10-CM | POA: Diagnosis not present

## 2019-10-07 DIAGNOSIS — E785 Hyperlipidemia, unspecified: Secondary | ICD-10-CM | POA: Diagnosis not present

## 2019-10-07 DIAGNOSIS — E039 Hypothyroidism, unspecified: Secondary | ICD-10-CM | POA: Diagnosis not present

## 2019-10-07 DIAGNOSIS — E663 Overweight: Secondary | ICD-10-CM | POA: Diagnosis not present

## 2019-10-07 DIAGNOSIS — R1013 Epigastric pain: Secondary | ICD-10-CM | POA: Diagnosis not present

## 2019-10-11 DIAGNOSIS — Z6828 Body mass index (BMI) 28.0-28.9, adult: Secondary | ICD-10-CM | POA: Diagnosis not present

## 2019-10-11 DIAGNOSIS — Z0001 Encounter for general adult medical examination with abnormal findings: Secondary | ICD-10-CM | POA: Diagnosis not present

## 2019-10-11 DIAGNOSIS — R000 Tachycardia, unspecified: Secondary | ICD-10-CM | POA: Diagnosis not present

## 2019-10-11 DIAGNOSIS — L249 Irritant contact dermatitis, unspecified cause: Secondary | ICD-10-CM | POA: Diagnosis not present

## 2019-10-11 DIAGNOSIS — E663 Overweight: Secondary | ICD-10-CM | POA: Diagnosis not present

## 2019-10-11 DIAGNOSIS — F411 Generalized anxiety disorder: Secondary | ICD-10-CM | POA: Diagnosis not present

## 2019-10-11 DIAGNOSIS — E782 Mixed hyperlipidemia: Secondary | ICD-10-CM | POA: Diagnosis not present

## 2019-10-11 DIAGNOSIS — E039 Hypothyroidism, unspecified: Secondary | ICD-10-CM | POA: Diagnosis not present

## 2019-12-02 ENCOUNTER — Telehealth: Payer: Medicare Other | Admitting: Cardiovascular Disease

## 2019-12-10 ENCOUNTER — Encounter: Payer: Self-pay | Admitting: *Deleted

## 2019-12-18 ENCOUNTER — Telehealth: Payer: Medicare Other | Admitting: Cardiovascular Disease

## 2019-12-24 ENCOUNTER — Telehealth: Payer: Self-pay | Admitting: Cardiology

## 2019-12-24 NOTE — Telephone Encounter (Signed)
  Patient Consent for Virtual Visit         Kathy Stephenson has provided verbal consent on 12/24/2019 for a virtual visit (video or telephone).   CONSENT FOR VIRTUAL VISIT FOR:  Kathy Stephenson  By participating in this virtual visit I agree to the following:  I hereby voluntarily request, consent and authorize [facility] and its employed or contracted physicians, physician assistants, nurse practitioners or other licensed health care professionals (the Practitioner), to provide me with telemedicine health care services (the "Services") as deemed necessary by the treating Practitioner. I acknowledge and consent to receive the Services by the Practitioner via telemedicine. I understand that the telemedicine visit will involve communicating with the Practitioner through live audiovisual communication technology and the disclosure of certain medical information by electronic transmission. I acknowledge that I have been given the opportunity to request an in-person assessment or other available alternative prior to the telemedicine visit and am voluntarily participating in the telemedicine visit.  I understand that I have the right to withhold or withdraw my consent to the use of telemedicine in the course of my care at any time, without affecting my right to future care or treatment, and that the Practitioner or I may terminate the telemedicine visit at any time. I understand that I have the right to inspect all information obtained and/or recorded in the course of the telemedicine visit and may receive copies of available information for a reasonable fee.  I understand that some of the potential risks of receiving the Services via telemedicine include:  Marland Kitchen Delay or interruption in medical evaluation due to technological equipment failure or disruption; . Information transmitted may not be sufficient (e.g. poor resolution of images) to allow for appropriate medical decision making by the  Practitioner; and/or  . In rare instances, security protocols could fail, causing a breach of personal health information.  Furthermore, I acknowledge that it is my responsibility to provide information about my medical history, conditions and care that is complete and accurate to the best of my ability. I acknowledge that Practitioner's advice, recommendations, and/or decision may be based on factors not within their control, such as incomplete or inaccurate data provided by me or distortions of diagnostic images or specimens that may result from electronic transmissions. I understand that the practice of medicine is not an exact science and that Practitioner makes no warranties or guarantees regarding treatment outcomes. I acknowledge that a copy of this consent can be made available to me via my patient portal ([facility]), or I can request a printed copy by calling the office of [facility].    I understand that my insurance will be billed for this visit.   I have read or had this consent read to me. . I understand the contents of this consent, which adequately explains the benefits and risks of the Services being provided via telemedicine.  . I have been provided ample opportunity to ask questions regarding this consent and the Services and have had my questions answered to my satisfaction. . I give my informed consent for the services to be provided through the use of telemedicine in my medical care

## 2019-12-25 ENCOUNTER — Encounter: Payer: Self-pay | Admitting: Cardiology

## 2019-12-25 ENCOUNTER — Telehealth (INDEPENDENT_AMBULATORY_CARE_PROVIDER_SITE_OTHER): Payer: Medicare Other | Admitting: Cardiology

## 2019-12-25 VITALS — BP 120/76 | HR 71 | Temp 97.3°F | Ht 67.0 in | Wt 181.0 lb

## 2019-12-25 DIAGNOSIS — I493 Ventricular premature depolarization: Secondary | ICD-10-CM

## 2019-12-25 DIAGNOSIS — E782 Mixed hyperlipidemia: Secondary | ICD-10-CM | POA: Diagnosis not present

## 2019-12-25 MED ORDER — ROSUVASTATIN CALCIUM 5 MG PO TABS: 5.0000 mg | ORAL_TABLET | ORAL | 3 refills | Status: DC

## 2019-12-25 NOTE — Progress Notes (Signed)
Virtual Visit via Telephone Note   This visit type was conducted due to national recommendations for restrictions regarding the COVID-19 Pandemic (e.g. social distancing) in an effort to limit this patient's exposure and mitigate transmission in our community.  Due to her co-morbid illnesses, this patient is at least at moderate risk for complications without adequate follow up.  This format is felt to be most appropriate for this patient at this time.  The patient did not have access to video technology/had technical difficulties with video requiring transitioning to audio format only (telephone).  All issues noted in this document were discussed and addressed.  No physical exam could be performed with this format.  Please refer to the patient's chart for her  consent to telehealth for [facility]   The patient was identified using 2 identifiers.  Date:  12/25/2019   ID:  Kathy Stephenson, DOB 22-Aug-1945, MRN [id number]  Patient Location: Home Provider Location: Office/Clinic  PCP:  Celene Squibb, MD  Cardiologist:  Rozann Lesches, MD Electrophysiologist:  None   Evaluation Performed:  Follow-Up Visit  Chief Complaint:  Cardiac follow-up  History of Present Illness:    Kathy Stephenson is a 74 y.o. female former patient of Dr. Bronson Ing now establishing follow-up with me.  She was last seen in March.  I reviewed extensive records and updated the chart.  She is doing reasonably well in terms of palpitations, reports a history of PVCs that have been well controlled on low-dose atenolol.  No dizziness or syncope.  Coronary calcium score obtained in March was 78.  Estimated 10-year CVD risk is 10.3%.  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 Nevada Crane in May revealed LDL 125.  Based on current ACC guidelines statin therapy is recommended in this setting and we discussed this today.  She does have a prior history of statin intolerance, recalls being on pravastatin.  We  have agreed to try very low-dose Crestor and see how she does.  Past Medical History:  Diagnosis Date  . Anxiety   . Hyperlipidemia   . Hypothyroidism   . Palpitations   . Seasonal allergies    Past Surgical History:  Procedure Laterality Date  . BREAST BIOPSY    . OTHER SURGICAL HISTORY     LEFT THORACOTOMY/BTL MENSICUS TEAR REPAIRS      Current Meds  Medication Sig  . ALPRAZolam (XANAX) 0.5 MG tablet Take 0.5 mg by mouth at bedtime as needed for anxiety.  Marland Kitchen atenolol (TENORMIN) 25 MG tablet Take 12.5 mg by mouth daily.  Marland Kitchen levothyroxine (SYNTHROID) 88 MCG tablet Take 88 mcg by mouth daily before breakfast.  . loratadine (CLARITIN) 10 MG tablet Take 10 mg by mouth daily.     Allergies:   Nitrofurantoin, Other, Penicillins, and Sulfa antibiotics   Social History   Tobacco Use  . Smoking status: Former Smoker    Types: Cigarettes  . Smokeless tobacco: Never Used  Substance Use Topics  . Alcohol use: Not on file  . Drug use: Not on file     Family Hx: The patient's family history includes Dementia in her mother; Diabetes in her mother; Hypertension in her father; Stroke in her father.  ROS:   No syncope.  Prior CV studies:   The following studies were reviewed today:  Coronary calcium score 09/03/2019: FINDINGS: Non-cardiac: See separate report from [facility].  Ascending Aorta: Normal size, measuring 34 mm at the mid ascending aorta, measured double oblique at  PA bifurcation. No significant calcification.  Pericardium: Normal  Coronary arteries: Arise from normal coronary cusps.  IMPRESSION: Coronary calcium score of 78. This was 64th percentile for age and sex matched control.  Labs/Other Tests and Data Reviewed:    EKG:  An ECG dated 08/26/2019 was personally reviewed today and demonstrated:  Sinus rhythm with low voltage.  Recent Labs:  November 2020: Hemoglobin 13.6, platelets 283, BUN 14, creatinine 0.83, TSH 0.58, total cholesterol  245, triglycerides 111, HDL 68, LDL 158 May 2021: Total cholesterol 206, triglycerides 91, HDL 65, LDL 125   Wt Readings from Last 3 Encounters:  12/25/19 181 lb (82.1 kg)  08/26/19 184 lb (83.5 kg)     Objective:    Vital Signs:  BP 120/76   Pulse 71   Temp (!) 97.3 F (36.3 C)   Ht 5\' 7"  (1.702 m)   Wt 181 lb (82.1 kg)   SpO2 96%   BMI 28.35 kg/m    Patient spoke in full sentences, not short of breath on the phone  ASSESSMENT & PLAN:    1.  History of palpitations, reported PVCs based on history, well controlled on low-dose atenolol.  No changes made today.  2.  Mixed hyperlipidemia, LDL most recently 125 and HDL 65.  Calculated 10-year CVD risk estimated at 10.3% and her most recent coronary calcium score was 78.  Based on current guidelines statin therapy recommended.  We will initiate Crestor 5 mg on Monday, Wednesday, and Friday, if she tolerates this check FLP and LFTs in 3 months.  Time:   Today, I have spent 12 minutes with the patient with telehealth technology discussing the above problems.     Medication Adjustments/Labs and Tests Ordered: Current medicines are reviewed at length with the patient today.  Concerns regarding medicines are outlined above.   Tests Ordered: No orders of the defined types were placed in this encounter.   Medication Changes: Meds ordered this encounter  Medications  . rosuvastatin (CRESTOR) 5 MG tablet    Sig: Take 1 tablet (5 mg total) by mouth 3 (three) times a week. Monday-Wednesday-Friday    Dispense:  36 tablet    Refill:  3    Follow Up: Review test results and determine disposition.  Signed, Rozann Lesches, MD  12/25/2019 4:33 PM    [place]

## 2019-12-25 NOTE — Patient Instructions (Addendum)
Medication Instructions:   Your physician has recommended you make the following change in your medication:   Start crestor 5 mg by mouth three times weekly on Monday-Wednesday-Friday  Continue other medications the same  Labwork:  Your physician recommends that you return for a FASTING lipid/liver profile: in 3 months if your are able to tolerate the crestor. Please contact our office at that time.  Testing/Procedures:  NONE  Follow-Up:  Your physician recommends that you schedule a follow-up appointment in: pending.  Any Other Special Instructions Will Be Listed Below (If Applicable).  If you need a refill on your cardiac medications before your next appointment, please call your pharmacy.

## 2019-12-26 DIAGNOSIS — H61301 Acquired stenosis of right external ear canal, unspecified: Secondary | ICD-10-CM | POA: Diagnosis not present

## 2019-12-26 DIAGNOSIS — H6121 Impacted cerumen, right ear: Secondary | ICD-10-CM | POA: Diagnosis not present

## 2019-12-26 DIAGNOSIS — H903 Sensorineural hearing loss, bilateral: Secondary | ICD-10-CM | POA: Diagnosis not present

## 2019-12-26 DIAGNOSIS — Z974 Presence of external hearing-aid: Secondary | ICD-10-CM | POA: Diagnosis not present

## 2020-01-16 DIAGNOSIS — H04123 Dry eye syndrome of bilateral lacrimal glands: Secondary | ICD-10-CM | POA: Diagnosis not present

## 2020-01-16 DIAGNOSIS — H43813 Vitreous degeneration, bilateral: Secondary | ICD-10-CM | POA: Diagnosis not present

## 2020-01-16 DIAGNOSIS — Z961 Presence of intraocular lens: Secondary | ICD-10-CM | POA: Diagnosis not present

## 2020-01-16 DIAGNOSIS — H43393 Other vitreous opacities, bilateral: Secondary | ICD-10-CM | POA: Diagnosis not present

## 2020-01-20 DIAGNOSIS — E785 Hyperlipidemia, unspecified: Secondary | ICD-10-CM | POA: Diagnosis not present

## 2020-01-20 DIAGNOSIS — E039 Hypothyroidism, unspecified: Secondary | ICD-10-CM | POA: Diagnosis not present

## 2020-01-20 DIAGNOSIS — F411 Generalized anxiety disorder: Secondary | ICD-10-CM | POA: Diagnosis not present

## 2020-01-20 DIAGNOSIS — R000 Tachycardia, unspecified: Secondary | ICD-10-CM | POA: Diagnosis not present

## 2020-02-12 DIAGNOSIS — L720 Epidermal cyst: Secondary | ICD-10-CM | POA: Diagnosis not present

## 2020-02-12 DIAGNOSIS — I781 Nevus, non-neoplastic: Secondary | ICD-10-CM | POA: Diagnosis not present

## 2020-02-17 DIAGNOSIS — E890 Postprocedural hypothyroidism: Secondary | ICD-10-CM | POA: Diagnosis not present

## 2020-02-17 DIAGNOSIS — M8589 Other specified disorders of bone density and structure, multiple sites: Secondary | ICD-10-CM | POA: Diagnosis not present

## 2020-03-02 DIAGNOSIS — Z23 Encounter for immunization: Secondary | ICD-10-CM | POA: Diagnosis not present

## 2020-03-30 ENCOUNTER — Telehealth: Payer: Self-pay | Admitting: *Deleted

## 2020-03-30 NOTE — Telephone Encounter (Signed)
-----   Message from Merlene Laughter, RN sent at 12/25/2019  4:33 PM EDT ----- Regarding: check with patient to see if she was able to tolerate crestor-if so, she need flp & lft''s Contact patient when this comes if not not already in chart about it Also-follow up with [facility] is based on this

## 2020-03-30 NOTE — Telephone Encounter (Signed)
Reports never starting crestor and has decided to discuss this with her PCP at her up coming visit.

## 2020-04-13 DIAGNOSIS — Z0001 Encounter for general adult medical examination with abnormal findings: Secondary | ICD-10-CM | POA: Diagnosis not present

## 2020-04-13 DIAGNOSIS — Z712 Person consulting for explanation of examination or test findings: Secondary | ICD-10-CM | POA: Diagnosis not present

## 2020-04-13 DIAGNOSIS — Z6828 Body mass index (BMI) 28.0-28.9, adult: Secondary | ICD-10-CM | POA: Diagnosis not present

## 2020-04-13 DIAGNOSIS — F419 Anxiety disorder, unspecified: Secondary | ICD-10-CM | POA: Diagnosis not present

## 2020-04-13 DIAGNOSIS — R7301 Impaired fasting glucose: Secondary | ICD-10-CM | POA: Diagnosis not present

## 2020-04-13 DIAGNOSIS — L249 Irritant contact dermatitis, unspecified cause: Secondary | ICD-10-CM | POA: Diagnosis not present

## 2020-04-13 DIAGNOSIS — F411 Generalized anxiety disorder: Secondary | ICD-10-CM | POA: Diagnosis not present

## 2020-04-13 DIAGNOSIS — E663 Overweight: Secondary | ICD-10-CM | POA: Diagnosis not present

## 2020-04-13 DIAGNOSIS — R000 Tachycardia, unspecified: Secondary | ICD-10-CM | POA: Diagnosis not present

## 2020-04-13 DIAGNOSIS — E782 Mixed hyperlipidemia: Secondary | ICD-10-CM | POA: Diagnosis not present

## 2020-04-13 DIAGNOSIS — Z23 Encounter for immunization: Secondary | ICD-10-CM | POA: Diagnosis not present

## 2020-04-13 DIAGNOSIS — E039 Hypothyroidism, unspecified: Secondary | ICD-10-CM | POA: Diagnosis not present

## 2020-04-13 DIAGNOSIS — R1013 Epigastric pain: Secondary | ICD-10-CM | POA: Diagnosis not present

## 2020-04-13 DIAGNOSIS — E785 Hyperlipidemia, unspecified: Secondary | ICD-10-CM | POA: Diagnosis not present

## 2020-04-17 DIAGNOSIS — F411 Generalized anxiety disorder: Secondary | ICD-10-CM | POA: Diagnosis not present

## 2020-04-17 DIAGNOSIS — E039 Hypothyroidism, unspecified: Secondary | ICD-10-CM | POA: Diagnosis not present

## 2020-04-17 DIAGNOSIS — E663 Overweight: Secondary | ICD-10-CM | POA: Diagnosis not present

## 2020-04-17 DIAGNOSIS — Z6829 Body mass index (BMI) 29.0-29.9, adult: Secondary | ICD-10-CM | POA: Diagnosis not present

## 2020-04-17 DIAGNOSIS — L249 Irritant contact dermatitis, unspecified cause: Secondary | ICD-10-CM | POA: Diagnosis not present

## 2020-04-17 DIAGNOSIS — E782 Mixed hyperlipidemia: Secondary | ICD-10-CM | POA: Diagnosis not present

## 2020-04-17 DIAGNOSIS — R000 Tachycardia, unspecified: Secondary | ICD-10-CM | POA: Diagnosis not present

## 2020-05-19 DIAGNOSIS — Z1283 Encounter for screening for malignant neoplasm of skin: Secondary | ICD-10-CM | POA: Diagnosis not present

## 2020-05-19 DIAGNOSIS — D225 Melanocytic nevi of trunk: Secondary | ICD-10-CM | POA: Diagnosis not present

## 2020-06-05 DIAGNOSIS — Z0000 Encounter for general adult medical examination without abnormal findings: Secondary | ICD-10-CM | POA: Diagnosis not present

## 2020-06-05 DIAGNOSIS — R000 Tachycardia, unspecified: Secondary | ICD-10-CM | POA: Diagnosis not present

## 2020-06-05 DIAGNOSIS — F419 Anxiety disorder, unspecified: Secondary | ICD-10-CM | POA: Diagnosis not present

## 2020-06-05 DIAGNOSIS — E039 Hypothyroidism, unspecified: Secondary | ICD-10-CM | POA: Diagnosis not present

## 2020-06-05 DIAGNOSIS — R1013 Epigastric pain: Secondary | ICD-10-CM | POA: Diagnosis not present

## 2020-08-06 ENCOUNTER — Encounter: Payer: Self-pay | Admitting: Cardiology

## 2020-08-11 ENCOUNTER — Telehealth: Payer: Self-pay | Admitting: Cardiology

## 2020-08-11 NOTE — Telephone Encounter (Signed)
Patient came by office to drop of recent labs from Dr. Durene Cal' office. She was in question if she is supposed to see Dr. Domenic Polite again.

## 2020-08-11 NOTE — Telephone Encounter (Signed)
Called patient with no answer. Left a vm to return call.

## 2020-08-11 NOTE — Telephone Encounter (Signed)
Error

## 2020-08-13 ENCOUNTER — Telehealth: Payer: Self-pay | Admitting: *Deleted

## 2020-08-13 NOTE — Telephone Encounter (Signed)
-----   Message from Satira Sark, MD sent at 08/11/2020  9:46 AM EST ----- Results reviewed.  LDL has come down to 100.  If she ever end up starting the low-dose Crestor?

## 2020-08-19 MED ORDER — ROSUVASTATIN CALCIUM 5 MG PO TABS: 5.0000 mg | ORAL_TABLET | Freq: Every day | ORAL | 1 refills | Status: DC

## 2020-08-19 NOTE — Telephone Encounter (Signed)
Patient informed, agreed and verbalized understanding of plan. Will increase crestor 5 mg to 3 times per week

## 2020-08-19 NOTE — Telephone Encounter (Signed)
If she is tolerating Crestor, would consider increasing to 3 times weekly to try to get her LDL closer to 70.  We can see her on a yearly basis with intermittent follow-up by Dr. [place] Crane.

## 2020-08-19 NOTE — Telephone Encounter (Signed)
Patient informed. Copy sent to PCP Reports taking crestor 5 mg twice weekly

## 2020-12-16 ENCOUNTER — Ambulatory Visit: Payer: Medicare Other | Admitting: Cardiology

## 2020-12-17 NOTE — Progress Notes (Signed)
Cardiology Office Note  Date: 12/18/2020   ID: Kathy Stephenson, DOB 05-14-1946, MRN [id number]  PCP:  Celene Squibb, MD  Cardiologist:  Rozann Lesches, MD Electrophysiologist:  None   Chief Complaint: 1 year follow up.   History of Present Illness: Kathy Stephenson is a 75 y.o. female with a history of HLD, palpitations, hypothyroidism.   She was last seen via telemedicine by Dr. Domenic Polite on 12/25/2019.  She was doing reasonably well in terms of palpitations.  She had a history of PVCs that had been well controlled on low-dose atenolol.  No complaints of dizziness or syncope.  Her coronary artery calcium score in March 2021 was 78.  Her previous LDL in May 2021 was on 125.  Based on current ACC guidelines statin therapy was recommended.  She had a prior history of statin intolerance.  She was started on Crestor 5 mg Monday Wednesdays and Fridays.  The plan was to follow-up with fasting lipid profile and liver function test in 3 months if she was tolerating the medication.  She is here for 1 year follow-up.  She states she had COVID infection in the interim since last visit.  Doing well since infection except for a lingering cough.  She denies any palpitations on atenolol.  States she has lost some weight and may need adjustment in her thyroid medication.  She is taking Crestor 5 mg 2 times a week.  States she has some mild myalgias and joint pain associated.  She continues on atenolol 2012.5 mg daily.  She denies any current anginal or exertional symptoms, palpitations or arrhythmias, orthostatic symptoms, CVA or TIA-like symptoms, PND or orthopnea.  No bleeding issues.  No claudication-like symptoms, DVT or PE-like symptoms, or lower extremity edema.  Past Medical History:  Diagnosis Date   Anxiety    Hyperlipidemia    Hypothyroidism    Palpitations    Seasonal allergies     Past Surgical History:  Procedure Laterality Date   BREAST BIOPSY     OTHER SURGICAL HISTORY     LEFT  THORACOTOMY/BTL MENSICUS TEAR REPAIRS     Current Outpatient Medications  Medication Sig Dispense Refill   ALPRAZolam (XANAX) 0.5 MG tablet Take 0.5 mg by mouth at bedtime as needed for anxiety.     atenolol (TENORMIN) 25 MG tablet Take 12.5 mg by mouth daily.     levothyroxine (SYNTHROID) 88 MCG tablet Take 88 mcg by mouth daily before breakfast.     loratadine (CLARITIN) 10 MG tablet Take 10 mg by mouth daily.     rosuvastatin (CRESTOR) 5 MG tablet Take 5 mg by mouth 2 (two) times a week.     No current facility-administered medications for this visit.   Allergies:  Nitrofurantoin, Other, Penicillins, and Sulfa antibiotics   Social History: The patient  reports that she has quit smoking. Her smoking use included cigarettes. She has never used smokeless tobacco.   Family History: The patient's family history includes Dementia in her mother; Diabetes in her mother; Hypertension in her father; Stroke in her father.   ROS:  Please see the history of present illness. Otherwise, complete review of systems is positive for none.  All other systems are reviewed and negative.   Physical Exam: VS:  BP 118/82   Pulse 70   Ht 5' 6.5" (1.689 m)   Wt 176 lb 9.6 oz (80.1 kg)   SpO2 96%   BMI 28.08 kg/m , BMI Body mass index is 28.08  kg/m.  Wt Readings from Last 3 Encounters:  12/18/20 176 lb 9.6 oz (80.1 kg)  12/25/19 181 lb (82.1 kg)  08/26/19 184 lb (83.5 kg)    General: Patient appears comfortable at rest. Neck: Supple, no elevated JVP or carotid bruits, no thyromegaly. Lungs: Clear to auscultation, nonlabored breathing at rest. Cardiac: Regular rate and rhythm, no S3 or significant systolic murmur, no pericardial rub. Extremities: No pitting edema, distal pulses 2+. Skin: Warm and dry. Musculoskeletal: No kyphosis. Neuropsychiatric: Alert and oriented x3, affect grossly appropriate.  ECG:  EKG 08/26/2019 sinus rhythm rate of 67.  Recent Labwork: No results found for requested  labs within last 8760 hours.  No results found for: CHOL, TRIG, HDL, CHOLHDL, VLDL, LDLCALC, LDLDIRECT  Other Studies Reviewed Today:  Coronary CT 09/03/2019 IMPRESSION: Coronary calcium score of 78. This was 64th percentile for age and sex matched control.  IMPRESSION: 1. No acute extracardiac findings in the imaged chest. 2. Left lower lobe scarring, likely related to the clinical history of left-sided thoracotomy    Assessment and Plan:  1. Palpitations   2. Mixed hyperlipidemia    1. Palpitations Currently denies any palpitations on atenolol.  Continue atenolol 12.5 mg p.o. daily.  2. Mixed hyperlipidemia She is continuing Crestor 5 mg p.o. 2 times per week.  States she has some mild myalgias associated but is able to tolerate the medication for the most part.  Medication Adjustments/Labs and Tests Ordered: Current medicines are reviewed at length with the patient today.  Concerns regarding medicines are outlined above.   Disposition: Follow-up with Dr. Domenic Polite or APP 1 year  Signed, Levell July, NP 12/18/2020 8:27 AM    [facility] [place], [place], [place] [address] Phone: [phone]; Fax: [phone]

## 2020-12-18 ENCOUNTER — Encounter: Payer: Self-pay | Admitting: Family Medicine

## 2020-12-18 ENCOUNTER — Ambulatory Visit (INDEPENDENT_AMBULATORY_CARE_PROVIDER_SITE_OTHER): Payer: Medicare Other | Admitting: Family Medicine

## 2020-12-18 VITALS — BP 118/82 | HR 70 | Ht 66.5 in | Wt 176.6 lb

## 2020-12-18 DIAGNOSIS — E782 Mixed hyperlipidemia: Secondary | ICD-10-CM | POA: Diagnosis not present

## 2020-12-18 DIAGNOSIS — R002 Palpitations: Secondary | ICD-10-CM

## 2020-12-18 NOTE — Patient Instructions (Signed)

## 2021-02-16 ENCOUNTER — Encounter (INDEPENDENT_AMBULATORY_CARE_PROVIDER_SITE_OTHER): Payer: Self-pay | Admitting: *Deleted

## 2021-03-26 ENCOUNTER — Other Ambulatory Visit (INDEPENDENT_AMBULATORY_CARE_PROVIDER_SITE_OTHER): Payer: Self-pay

## 2021-03-26 ENCOUNTER — Encounter (INDEPENDENT_AMBULATORY_CARE_PROVIDER_SITE_OTHER): Payer: Self-pay

## 2021-03-26 ENCOUNTER — Telehealth (INDEPENDENT_AMBULATORY_CARE_PROVIDER_SITE_OTHER): Payer: Self-pay

## 2021-03-26 DIAGNOSIS — Z1211 Encounter for screening for malignant neoplasm of colon: Secondary | ICD-10-CM

## 2021-03-26 MED ORDER — PEG 3350-KCL-NA BICARB-NACL 420 G PO SOLR: 4000.0000 mL | ORAL | 0 refills | Status: DC

## 2021-03-26 NOTE — Telephone Encounter (Signed)
Kathy Stephenson, CMA  

## 2021-03-26 NOTE — Telephone Encounter (Signed)
Referring MD/PCP: [place] Crane  Procedure: Tcs  Reason/Indication:  Screening   Has patient had this procedure before?  yes  If so, when, by whom and where?  2012  Is there a family history of colon cancer?  no  Who?  What age when diagnosed?    Is patient diabetic? If yes, Type 1 or Type 2   no      Does patient have prosthetic heart valve or mechanical valve?  no  Do you have a pacemaker/defibrillator?  no  Has patient ever had endocarditis/atrial fibrillation? no  Does patient use oxygen? no  Has patient had joint replacement within last 12 months?  no  Is patient constipated or do they take laxatives? no  Does patient have a history of alcohol/drug use?  no  Have you had a stroke/heart attack last 6 mths? no  Do you take medicine for weight loss?  no  For female patients,: do you still have your menstrual cycle? no  Is patient on blood thinner such as Coumadin, Plavix and/or Aspirin? no  Medications: atenolol 25mg  1/2 tab daily, rosuvastatin 5 mg 1-2 tab a week, levothyroxine 88 mcg daily, melatonin 3 mg nightly, loratadine daily, mag citrate 2 gummies daily, Vit D 3 5000 IU bid, MVI daily, Vit E 400 IU Daily,  Co Q 10 daily, Vit K 100 mcg daily, Vit Z daily, milk thistle 1000 mg echinacea 400 mg daily, black elderberry 2000 mg daily, turmeric 1500 mg daily, Re boost w/zinc daily, probiotic daily  Allergies: PCN, Macrobid, sulfa drugs  Medication Adjustment per Dr Laural Golden HOLD VIT E 10 DAYS PRIOR   Procedure date & time: Thursday 04/22/21 AT 10:30 AM

## 2021-03-30 IMAGING — CT CT CARDIAC CORONARY ARTERY CALCIUM SCORE
3 series · 14 of 20 positions shown, 15 images · non-contrast
Comparison: None.
COMPARISON: None.

Addendum:
EXAM:
OVER-READ INTERPRETATION  CT CHEST

The following report is an over-read performed by radiologist Dr.
Nazmul Hossain Eliwa [REDACTED] on 09/03/2019. This over-read
does not include interpretation of cardiac or coronary anatomy or
pathology. The calcium score interpretation by the cardiologist is
attached.
CLINICAL DATA: Risk stratification
Coronary Calcium Score
TECHNIQUE: The patient was scanned on a Siemens Force scanner. Axial
non-contrast 3 mm slices were carried out through the heart. The
data set was analyzed on a dedicated work station and scored using
the Agatson method.

[Series 2: casc 3.0 bv41 2 bestdiast 68 % · axial · 0.37mm/px · z∈[-214,-136]mm · 4 of 44 slices shown, 5 images]
[im 9/44  vessel]
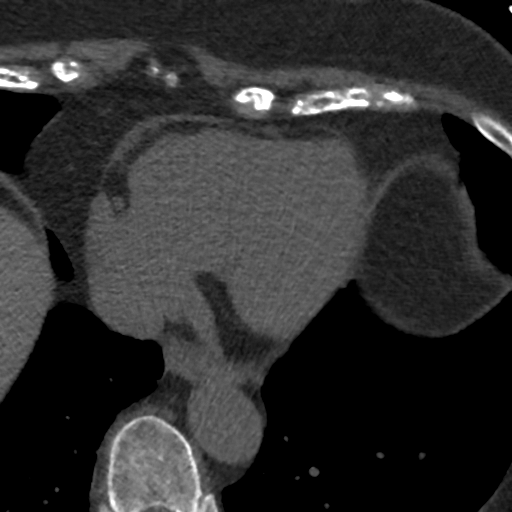
[im 9/44  lung]
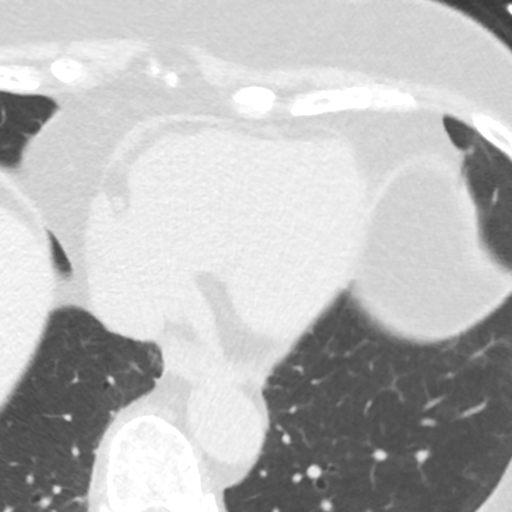
[im 18/44  vessel]
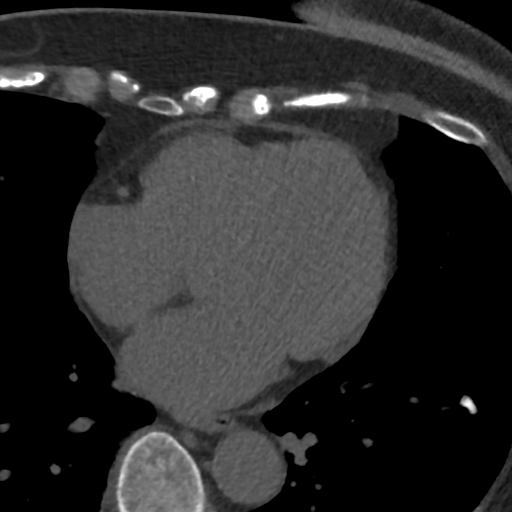
[im 26/44  vessel]
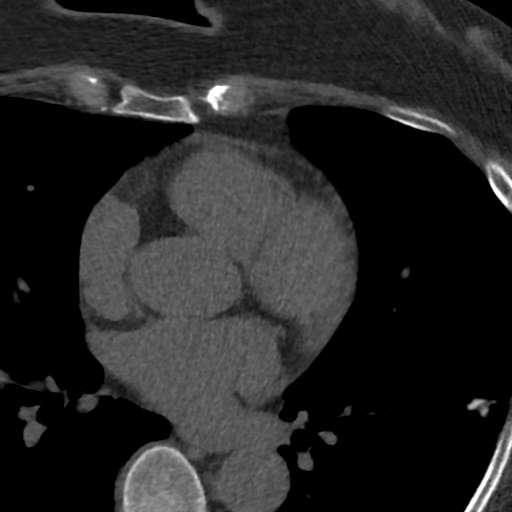
[im 35/44  vessel]
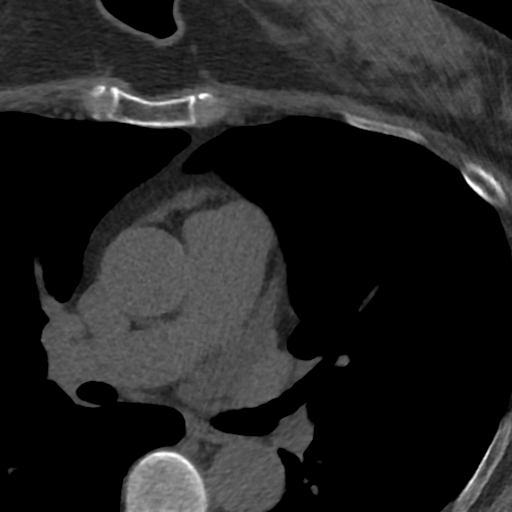

[Series 3: lung 70 % · axial · 0.70mm/px · z∈[-216,-132]mm · 5 of 44 slices shown]
[im 8/44  lung]
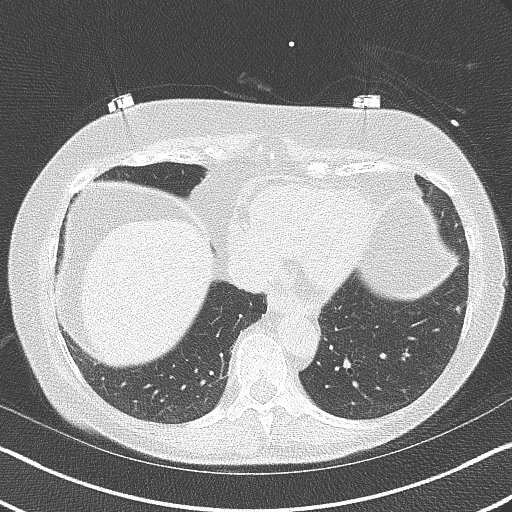
[im 15/44  lung]
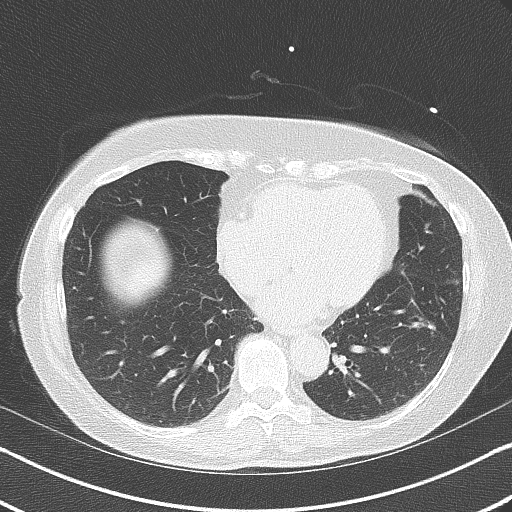
[im 22/44  lung]
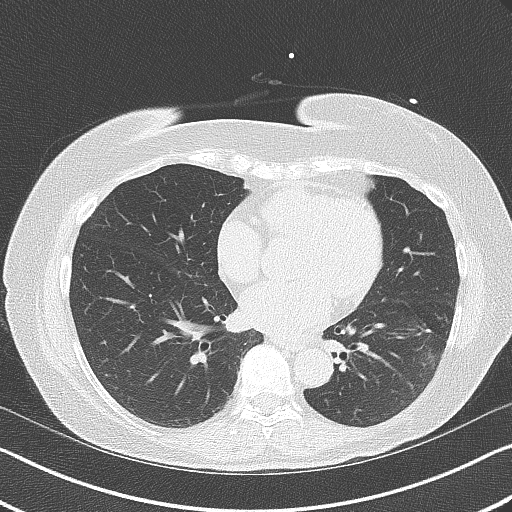
[im 29/44  lung]
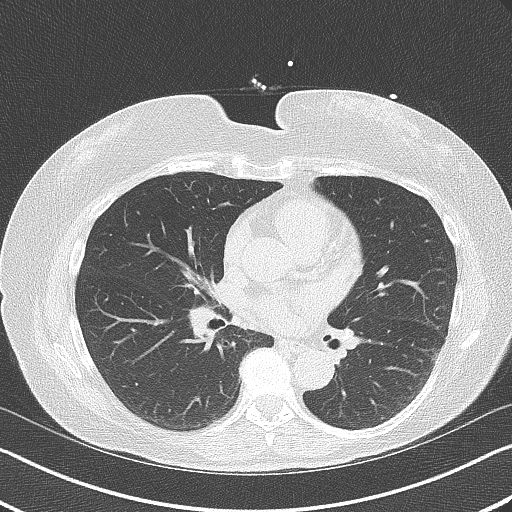
[im 36/44  lung]
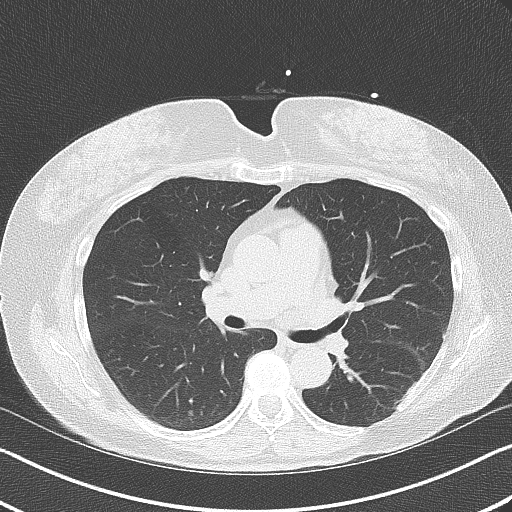

[Series 4: lung st 70 % · axial · 0.70mm/px · z∈[-216,-132]mm · 5 of 44 slices shown]
[im 8/44  lung]
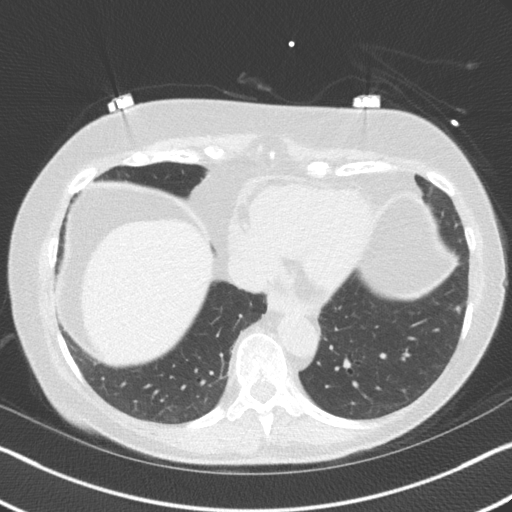
[im 15/44  lung]
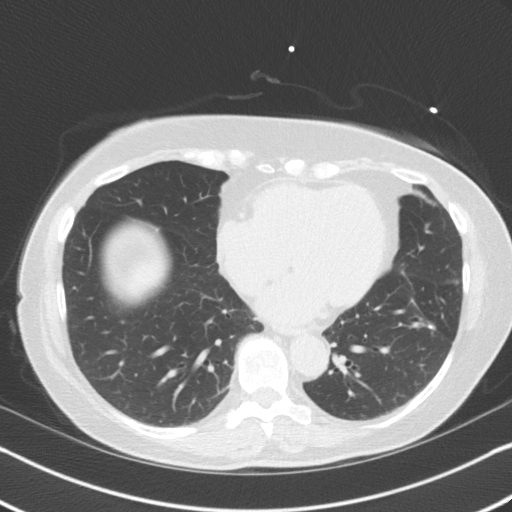
[im 22/44  lung]
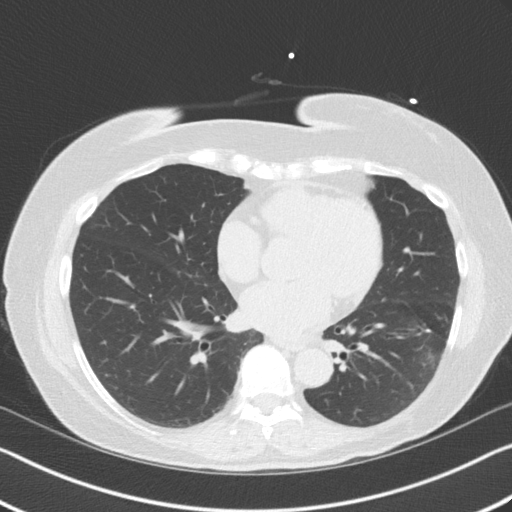
[im 29/44  lung]
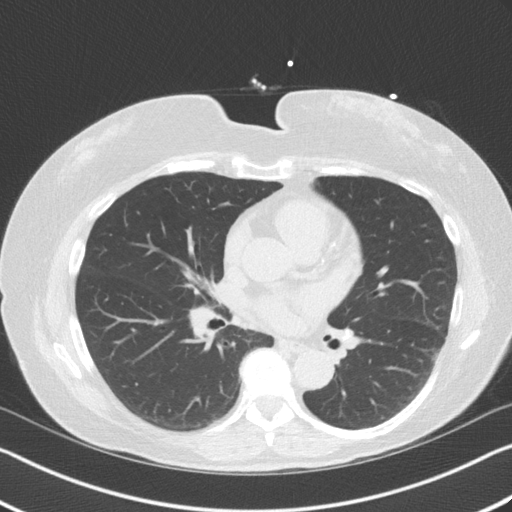
[im 36/44  lung]
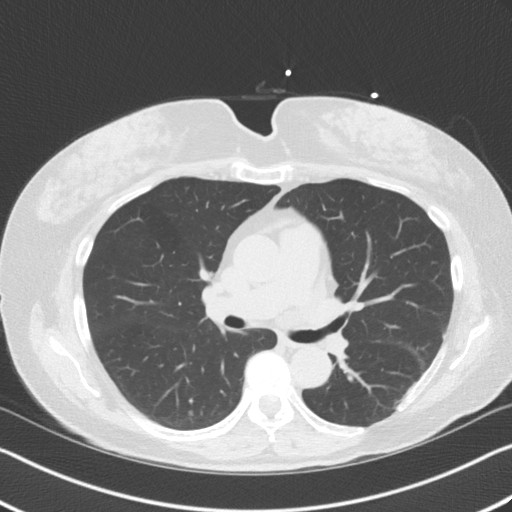

[14 of 20 positions shown; findings below may reference images not displayed]

FINDINGS: Vascular: Normal aortic caliber.

Mediastinum/Nodes: No imaged thoracic adenopathy.

Lungs/Pleura: No pleural fluid. Probable surgical changes in the
lateral left lower lobe, with soft tissue thickening and
hyperattenuation likely indicative of calcification. Example 32/3.

Upper Abdomen: Normal imaged portions of the liver, spleen, stomach.

Musculoskeletal: No acute osseous abnormality.
IMPRESSION: 1. No acute extracardiac findings in the imaged chest.
2. Left lower lobe scarring, likely related to the clinical history
of left-sided thoracotomy
FINDINGS: Non-cardiac: See separate report from [REDACTED].

Ascending Aorta: Normal size, measuring 34 mm at the mid ascending
aorta, measured double oblique at PA bifurcation. No significant
calcification.

Pericardium: Normal

Coronary arteries: Arise from normal coronary cusps.
IMPRESSION: Coronary calcium score of 78. This was 64th percentile for age and
sex matched control.

[employer]

*** End of Addendum ***
EXAM:
OVER-READ INTERPRETATION  CT CHEST

The following report is an over-read performed by radiologist Dr.
Nazmul Hossain Eliwa [REDACTED] on 09/03/2019. This over-read
does not include interpretation of cardiac or coronary anatomy or
pathology. The calcium score interpretation by the cardiologist is
attached.
FINDINGS: Vascular: Normal aortic caliber.

Mediastinum/Nodes: No imaged thoracic adenopathy.

Lungs/Pleura: No pleural fluid. Probable surgical changes in the
lateral left lower lobe, with soft tissue thickening and
hyperattenuation likely indicative of calcification. Example 32/3.

Upper Abdomen: Normal imaged portions of the liver, spleen, stomach.

Musculoskeletal: No acute osseous abnormality.
IMPRESSION: 1. No acute extracardiac findings in the imaged chest.
2. Left lower lobe scarring, likely related to the clinical history
of left-sided thoracotomy

## 2021-04-22 ENCOUNTER — Encounter (HOSPITAL_COMMUNITY): Payer: Self-pay | Admitting: Internal Medicine

## 2021-04-22 ENCOUNTER — Ambulatory Visit (HOSPITAL_COMMUNITY)
Admission: RE | Admit: 2021-04-22 | Discharge: 2021-04-22 | Disposition: A | Discharge status: Home / Self Care | Payer: Medicare Other | Attending: Internal Medicine | Admitting: Internal Medicine

## 2021-04-22 ENCOUNTER — Encounter (HOSPITAL_COMMUNITY)
Admission: RE | Disposition: A | Discharge status: Home / Self Care | Payer: Self-pay | Source: Home / Self Care | Attending: Internal Medicine

## 2021-04-22 ENCOUNTER — Other Ambulatory Visit: Payer: Self-pay

## 2021-04-22 DIAGNOSIS — Z1211 Encounter for screening for malignant neoplasm of colon: Secondary | ICD-10-CM | POA: Diagnosis present

## 2021-04-22 DIAGNOSIS — D126 Benign neoplasm of colon, unspecified: Secondary | ICD-10-CM | POA: Diagnosis not present

## 2021-04-22 DIAGNOSIS — K5730 Diverticulosis of large intestine without perforation or abscess without bleeding: Secondary | ICD-10-CM | POA: Diagnosis not present

## 2021-04-22 DIAGNOSIS — K635 Polyp of colon: Secondary | ICD-10-CM | POA: Insufficient documentation

## 2021-04-22 DIAGNOSIS — K644 Residual hemorrhoidal skin tags: Secondary | ICD-10-CM | POA: Insufficient documentation

## 2021-04-22 DIAGNOSIS — D125 Benign neoplasm of sigmoid colon: Secondary | ICD-10-CM | POA: Diagnosis not present

## 2021-04-22 DIAGNOSIS — D123 Benign neoplasm of transverse colon: Secondary | ICD-10-CM | POA: Diagnosis not present

## 2021-04-22 HISTORY — PX: COLONOSCOPY: SHX5424

## 2021-04-22 HISTORY — PX: BIOPSY: SHX5522

## 2021-04-22 LAB — HM COLONOSCOPY

## 2021-04-22 SURGERY — COLONOSCOPY
Anesthesia: Moderate Sedation

## 2021-04-22 MED ORDER — MEPERIDINE HCL 50 MG/ML IJ SOLN
INTRAMUSCULAR | Status: DC | PRN
  Administered 2021-04-22 (×2): 25 mg

## 2021-04-22 MED ORDER — MIDAZOLAM HCL 5 MG/5ML IJ SOLN
INTRAMUSCULAR | Status: DC | PRN
  Administered 2021-04-22: 2 mg via INTRAVENOUS
  Administered 2021-04-22: 1 mg via INTRAVENOUS
  Administered 2021-04-22: 2 mg via INTRAVENOUS

## 2021-04-22 MED ORDER — SODIUM CHLORIDE 0.9 % IV SOLN: INTRAVENOUS | Status: DC

## 2021-04-22 MED ORDER — MIDAZOLAM HCL 5 MG/5ML IJ SOLN
INTRAMUSCULAR | Status: AC
  Filled 2021-04-22: qty 10

## 2021-04-22 MED ORDER — MEPERIDINE HCL 50 MG/ML IJ SOLN
INTRAMUSCULAR | Status: AC
  Filled 2021-04-22: qty 1

## 2021-04-22 NOTE — Discharge Instructions (Addendum)
No aspirin or NSAIDs for 24 hours ?Resume scheduled medications as before ?High-fiber diet ?No driving for 24 hours. ?Physician will call with biopsy results. ?

## 2021-04-22 NOTE — Op Note (Signed)
[facility] Patient Name: Kathy Stephenson Procedure Date: 04/22/2021 10:09 AM MRN: [id number] Date of Birth: 1946/04/30 Attending MD: Hildred Laser , MD CSN: [id number] Age: 75 Admit Type: Outpatient Procedure:                Colonoscopy Indications:              Screening for colorectal malignant neoplasm Providers:                Hildred Laser, MD, Lambert Mody, Nelma Rothman,                            Technician Referring MD:             Delphina Cahill, MD Medicines:                Meperidine 50 mg IV, Midazolam 5 mg IV Complications:            No immediate complications. Estimated Blood Loss:     Estimated blood loss was minimal. Procedure:                Pre-Anesthesia Assessment:                           - Prior to the procedure, a History and Physical                            was performed, and patient medications and                            allergies were reviewed. The patient's tolerance of                            previous anesthesia was also reviewed. The risks                            and benefits of the procedure and the sedation                            options and risks were discussed with the patient.                            All questions were answered, and informed consent                            was obtained. Prior Anticoagulants: The patient has                            taken no previous anticoagulant or antiplatelet                            agents except for NSAID medication. ASA Grade                            Assessment: II - A patient with mild systemic  disease. After reviewing the risks and benefits,                            the patient was deemed in satisfactory condition to                            undergo the procedure.                           After obtaining informed consent, the colonoscope                            was passed under direct vision. Throughout the                             procedure, the patient's blood pressure, pulse, and                            oxygen saturations were monitored continuously. The                            [id number]-HQ190L ([id number]) scope was introduced through                            the anus and advanced to the the cecum, identified                            by appendiceal orifice and ileocecal valve. The                            colonoscopy was performed without difficulty. The                            patient tolerated the procedure well. The quality                            of the bowel preparation was excellent. The                            ileocecal valve, appendiceal orifice, and rectum                            were photographed. Scope In: 10:36:41 AM Scope Out: 11:01:27 AM Scope Withdrawal Time: 0 hours 19 minutes 18 seconds  Total Procedure Duration: 0 hours 24 minutes 46 seconds  Findings:      The perianal and digital rectal examinations were normal.      A small polyp was found in the mid transverse colon. Biopsies were taken       with a cold forceps for histology. The pathology specimen was placed       into Bottle Number 1.      Two flat polyps were found in the distal sigmoid colon and distal       transverse colon. The polyps were small in size. These polyps were       removed  with a cold snare. Resection and retrieval were complete. The       pathology specimen was placed into Bottle Number 1.      Multiple diverticula were found in the sigmoid colon, descending colon,       splenic flexure and distal transverse colon.      External hemorrhoids were found during retroflexion. The hemorrhoids       were small. Impression:               - One small polyp in the mid transverse colon.                            Biopsied.                           - Two small polyps in the distal sigmoid colon and                            in the distal transverse colon, removed with a cold                            snare.  Resected and retrieved.                           - Diverticulosis in the sigmoid colon, in the                            descending colon, at the splenic flexure and in the                            distal transverse colon.                           - External hemorrhoids. Moderate Sedation:      Moderate (conscious) sedation was administered by the endoscopy nurse       and supervised by the endoscopist. The following parameters were       monitored: oxygen saturation, heart rate, blood pressure, CO2       capnography and response to care. Total physician intraservice time was       30 minutes. Recommendation:           - Patient has a contact number available for                            emergencies. The signs and symptoms of potential                            delayed complications were discussed with the                            patient. Return to normal activities tomorrow.                            Written discharge instructions were provided to the  patient.                           - High fiber diet today.                           - Continue present medications.                           - No aspirin, ibuprofen, naproxen, or other                            non-steroidal anti-inflammatory drugs for 1 day.                           - Await pathology results.                           - Repeat colonoscopy is recommended. The                            colonoscopy date will be determined after pathology                            results from today's exam become available for                            review. Procedure Code(s):        --- Professional ---                           [phone], Colonoscopy, flexible; with removal of                            tumor(s), polyp(s), or other lesion(s) by snare                            technique                           45380, 59, Colonoscopy, flexible; with biopsy,                            single or  multiple                           99153, Moderate sedation; each additional 15                            minutes intraservice time                           G0500, Moderate sedation services provided by the                            same physician or other qualified health care  professional performing a gastrointestinal                            endoscopic service that sedation supports,                            requiring the presence of an independent trained                            observer to assist in the monitoring of the                            patient's level of consciousness and physiological                            status; initial 15 minutes of intra-service time;                            patient age 29 years or older (additional time may                            be reported with [phone], as appropriate) Diagnosis Code(s):        --- Professional ---                           K63.5, Polyp of colon                           Z12.11, Encounter for screening for malignant                            neoplasm of colon                           K64.4, Residual hemorrhoidal skin tags                           K57.30, Diverticulosis of large intestine without                            perforation or abscess without bleeding CPT copyright 2019 [employer]. All rights reserved. The codes documented in this report are preliminary and upon coder review may  be revised to meet current compliance requirements. Hildred Laser, MD Hildred Laser, MD 04/22/2021 11:10:10 AM This report has been signed electronically. Number of Addenda: 0

## 2021-04-22 NOTE — H&P (Signed)
Kathy Stephenson is an 75 y.o. female.   Chief Complaint: Kathy Stephenson is here for colonoscopy HPI: Kathy Stephenson is 62 year old Caucasian female who is here for screening colonoscopy.  Last exam was normal 10 years ago.  She denies abdominal pain change in bowel habits or rectal bleeding.  Family history is negative for colorectal carcinoma.  She does not take aspirin or anticoagulants.  Past Medical History:  Diagnosis Date   Anxiety    Hyperlipidemia    Hypothyroidism    Palpitations    Seasonal allergies     Past Surgical History:  Procedure Laterality Date   BREAST BIOPSY     OTHER SURGICAL HISTORY     LEFT THORACOTOMY/BTL MENSICUS TEAR REPAIRS    TUBAL LIGATION      Family History  Problem Relation Age of Onset   Diabetes Mother    Dementia Mother    Hypertension Father    Stroke Father    Social History:  reports that she has quit smoking. Her smoking use included cigarettes. She has never used smokeless tobacco. No history on file for alcohol use and drug use.  Allergies:  Allergies  Allergen Reactions   Aspartame And Phenylalanine Diarrhea and Nausea And Vomiting   Celestone [Betamethasone] Other (See Comments)    Raised  red spots on face and cheek of bottom   Ciprofloxacin Other (See Comments)    Increases heart rate   Nitrofurantoin Hives   Other Itching    HONEY - SCRATCHY THROAT   Penicillins Hives   Sulfa Antibiotics     Chest tightness    Medications Prior to Admission  Medication Sig Dispense Refill   atenolol (TENORMIN) 25 MG tablet Take 12.5 mg by mouth daily.     Calcium-Magnesium (CAL-MAG PO) Take 1 tablet by mouth daily.     Cholecalciferol (VITAMIN D-3) 125 MCG (5000 UT) TABS Take 5,000 Units by mouth daily.     levothyroxine (SYNTHROID) 88 MCG tablet Take 88 mcg by mouth daily before breakfast.     loratadine (CLARITIN) 10 MG tablet Take 10 mg by mouth daily.     Menaquinone-7 (VITAMIN K2 PO) Take 1 tablet by mouth daily.     Multiple  Vitamins-Minerals (MULTIVITAMIN WITH MINERALS) tablet Take 1 tablet by mouth daily.     polyethylene glycol-electrolytes (TRILYTE) 420 g solution Take 4,000 mLs by mouth as directed. 4000 mL 0   rosuvastatin (CRESTOR) 5 MG tablet Take 5 mg by mouth once a week.     vitamin C (ASCORBIC ACID) 500 MG tablet Take 1,000 mg by mouth daily. Chewable     vitamin E 180 MG (400 UNITS) capsule Take 400 Units by mouth daily.     ALPRAZolam (XANAX) 0.5 MG tablet Take 0.25 mg by mouth at bedtime as needed for anxiety (When fly).     ibuprofen (ADVIL) 200 MG tablet Take 200 mg by mouth every 6 (six) hours as needed for mild pain or moderate pain.      No results found for this or any previous visit (from the past 48 hour(s)). No results found.  Review of Systems  Blood pressure 139/71, temperature 97.8 F (36.6 C), temperature source Oral, resp. rate 16, height 5\' 6"  (1.676 m), weight 79.8 kg, SpO2 100 %. Physical Exam Eyes:     General: No scleral icterus.    Conjunctiva/sclera: Conjunctivae normal.     Pupils: Pupils are equal, round, and reactive to light.  Cardiovascular:     Rate and Rhythm: Normal  rate and regular rhythm.     Heart sounds: Normal heart sounds. No murmur heard. Pulmonary:     Effort: Pulmonary effort is normal.     Breath sounds: Normal breath sounds.  Abdominal:     General: There is no distension.     Palpations: Abdomen is soft. There is no mass.     Tenderness: There is no abdominal tenderness.  Musculoskeletal:        General: No swelling.     Cervical back: Neck supple.  Lymphadenopathy:     Cervical: No cervical adenopathy.  Skin:    General: Skin is warm and dry.  Neurological:     Mental Status: She is alert.     Assessment/Plan  Average risk screening colonoscopy.  Hildred Laser, MD 04/22/2021, 10:26 AM

## 2021-04-23 LAB — SURGICAL PATHOLOGY

## 2021-04-26 ENCOUNTER — Encounter (HOSPITAL_COMMUNITY): Payer: Self-pay | Admitting: Internal Medicine

## 2021-05-03 ENCOUNTER — Encounter (INDEPENDENT_AMBULATORY_CARE_PROVIDER_SITE_OTHER): Payer: Self-pay | Admitting: *Deleted

## 2021-05-05 ENCOUNTER — Other Ambulatory Visit: Payer: Self-pay

## 2021-05-05 ENCOUNTER — Ambulatory Visit (INDEPENDENT_AMBULATORY_CARE_PROVIDER_SITE_OTHER): Payer: Medicare Other | Admitting: Allergy & Immunology

## 2021-05-05 ENCOUNTER — Telehealth: Payer: Self-pay | Admitting: *Deleted

## 2021-05-05 ENCOUNTER — Encounter: Payer: Self-pay | Admitting: Allergy & Immunology

## 2021-05-05 VITALS — BP 122/82 | HR 65 | Temp 97.3°F | Resp 18 | Ht 66.5 in | Wt 182.4 lb

## 2021-05-05 DIAGNOSIS — D721 Eosinophilia, unspecified: Secondary | ICD-10-CM | POA: Insufficient documentation

## 2021-05-05 DIAGNOSIS — K9049 Malabsorption due to intolerance, not elsewhere classified: Secondary | ICD-10-CM | POA: Diagnosis not present

## 2021-05-05 DIAGNOSIS — J3089 Other allergic rhinitis: Secondary | ICD-10-CM | POA: Diagnosis not present

## 2021-05-05 DIAGNOSIS — J302 Other seasonal allergic rhinitis: Secondary | ICD-10-CM

## 2021-05-05 NOTE — Telephone Encounter (Signed)
Medical Records Release requesting lung biopsy results has been faxed to [facility] located at [place]. [address], [place] [address] Phone [phone] Fax [phone]

## 2021-05-05 NOTE — Patient Instructions (Addendum)
1. Seasonal and perennial allergic rhinitis - Testing today showed: ragweed, trees, indoor molds, outdoor molds, dust mites, cat, dog, and cockroach. - Copy of test results provided.  - Avoidance measures provided. - Stop taking: loratadine - Start taking: Zyrtec (cetirizine) 10mg  tablet once daily (this is slightly stronger than the loratadine and might provide some more help) - You can use an extra dose of the antihistamine, if needed, for breakthrough symptoms.  - Consider nasal saline rinses 1-2 times daily to remove allergens from the nasal cavities as well as help with mucous clearance (this is especially helpful to do before the nasal sprays are given) - Consider allergy shots as a means of long-term control. - Allergy shots "re-train" and "reset" the immune system to ignore environmental allergens and decrease the resulting immune response to those allergens (sneezing, itchy watery eyes, runny nose, nasal congestion, etc).    - Allergy shots improve symptoms in 75-85% of patients.  - We can discuss more at the next appointment if the medications are not working for you.  2. Eosinophilia, unspecified type - We are going to trend this to see where the numbers are heading. - I do not think that we need to do a work-up for hypereosinophilic syndrome at this point. - Your testing today does indicate a lot of environmental allergies, which can certainly lead to elevated eosinophils. - We are going to get your results from your lung biopsy, which they hopefully saved. - Histoplasmosis can cause elevated eosinophils, so we may consider another chest x-ray just to see if any new spots are appearing. - I do NOT think that this is likely.  3. Return in about 2 months (around 07/05/2021).    Please inform us of any Emergency Department visits, hospitalizations, or changes in symptoms. Call us before going to the ED for breathing or allergy symptoms since we might be able to fit you in for a sick  visit. Feel free to contact us anytime with any questions, problems, or concerns.  It was a pleasure to meet you today!  Websites that have reliable patient information: 1. American Academy of Asthma, Allergy, and Immunology: www.aaaai.org 2. Food Allergy Research and Education (FARE): foodallergy.org 3. Mothers of Asthmatics: http://www.asthmacommunitynetwork.org 4. American College of Allergy, Asthma, and Immunology: www.acaai.org   COVID-19 Vaccine Information can be found at: [email or web address] For questions related to vaccine distribution or appointments, please email [email or web address] or call [phone].   We realize that you might be concerned about having an allergic reaction to the COVID19 vaccines. To help with that concern, WE ARE OFFERING THE COVID19 VACCINES IN OUR OFFICE! Ask the front desk for dates!     "Like" [place] on Facebook and Instagram for our latest updates!      A healthy democracy works best when [employer] participate! Make sure you are registered to vote! If you have moved or changed any of your contact information, you will need to get this updated before voting!  In some cases, you MAY be able to register to vote online: [email or web address]     Airborne Adult Perc - 05/05/21 0922     Time Antigen Placed [place]    Location Back    Number of Test 59    Panel 1 Select    1. Control-Buffer 50% Glycerol Negative    2. Control-Histamine 1 mg/ml 2+    3. Albumin saline Negative    4. [place] Negative    5. [place] Negative  6. Johnson Negative    7. Merino Blue Negative    8. Meadow Fescue Negative    9. Perennial Rye Negative    10. Sweet Vernal Negative    11. Timothy Negative    12. Cocklebur Negative    13. Burweed Marshelder Negative    14. Ragweed, short Negative    15. Ragweed, Giant Negative    16. Plantain,  English  Negative    17. Lamb's Quarters Negative    18. Sheep Sorrell Negative    19. Rough Pigweed Negative    20. Marsh Elder, Rough Negative    21. Mugwort, Common Negative    22. Ash mix Negative    23. Birch mix Negative    24. Beech American Negative    25. Box, Elder Negative    26. Cedar, red Negative    27. Cottonwood, [place] Negative    28. Elm mix Negative    29. Hickory Negative    30. Maple mix Negative    31. Oak, [place] mix Negative    32. Pecan Pollen Negative    33. Pine mix Negative    34. Sycamore Eastern Negative    35. [place], Black Pollen Negative    36. Alternaria alternata Negative    37. Cladosporium Herbarum Negative    38. Aspergillus mix Negative    39. Penicillium mix Negative    40. Bipolaris sorokiniana (Helminthosporium) Negative    41. Drechslera spicifera (Curvularia) Negative    42. Mucor plumbeus Negative    43. Fusarium moniliforme Negative    44. Aureobasidium pullulans (pullulara) Negative    45. Rhizopus oryzae Negative    46. Botrytis cinera Negative    47. Epicoccum nigrum Negative    48. Phoma betae Negative    49. Candida Albicans Negative    50. Trichophyton mentagrophytes Negative    51. Mite, D Farinae  5,000 AU/ml Negative    52. Mite, D Pteronyssinus  5,000 AU/ml Negative    53. Cat Hair 10,000 BAU/ml Negative    54.  Dog Epithelia Negative    55. Mixed Feathers Negative    56. Horse Epithelia Negative    57. Cockroach, German Negative    58. Mouse Negative    59. Tobacco Leaf Negative             Intradermal - 05/05/21 0954     Time Antigen Placed 6629    Allergen Manufacturer Lavella Hammock    Location Arm    Number of Test 15    Intradermal Select    Control Negative    [place] Negative    Johnson Negative    7 Grass Negative    Ragweed mix 1+    Weed mix Negative    Tree mix 2+    Mold 1 3+    Mold 2 3+    Mold 3 3+    Mold 4 3+    Cat 2+    Dog 3+    Cockroach 1+    Mite mix 4+             Food Adult  Perc - 05/05/21 0900     Time Antigen Placed 4765    Allergen Manufacturer Lavella Hammock    Location Back    Number of allergen test 17    1. Peanut Negative    2. Soybean Negative    3. Wheat Negative    4. Sesame Negative    5. Milk, cow Negative  6. Egg White, Chicken Negative    7. Casein Negative    8. Shellfish Mix Negative    9. Fish Mix Negative    10. Cashew Negative    11. Pecan Food Negative    12. Aurora Negative    13. Almond Negative    14. Hazelnut Negative    15. [place] nut Negative    16. Coconut Negative    17. Pistachio Negative             Reducing Pollen Exposure  The American Academy of Allergy, Asthma and Immunology suggests the following steps to reduce your exposure to pollen during allergy seasons.    Do not hang sheets or clothing out to dry; pollen may collect on these items. Do not mow lawns or spend time around freshly cut grass; mowing stirs up pollen. Keep windows closed at night.  Keep car windows closed while driving. Minimize morning activities outdoors, a time when pollen counts are usually at their highest. Stay indoors as much as possible when pollen counts or humidity is high and on windy days when pollen tends to remain in the air longer. Use air conditioning when possible.  Many air conditioners have filters that trap the pollen spores. Use a HEPA room air filter to remove pollen form the indoor air you breathe.  Control of Mold Allergen   Mold and fungi can grow on a variety of surfaces provided certain temperature and moisture conditions exist.  Outdoor molds grow on plants, decaying vegetation and soil.  The major outdoor mold, Alternaria and Cladosporium, are found in very high numbers during hot and dry conditions.  Generally, a late Summer - Fall peak is seen for common outdoor fungal spores.  Rain will temporarily lower outdoor mold spore count, but counts rise rapidly when the rainy period ends.  The most important indoor  molds are Aspergillus and Penicillium.  Dark, humid and poorly ventilated basements are ideal sites for mold growth.  The next most common sites of mold growth are the bathroom and the kitchen.  Outdoor (Seasonal) Mold Control  Positive outdoor molds via skin testing: Alternaria, Cladosporium, Bipolaris (Helminthsporium), Drechslera (Curvalaria), and Mucor  Use air conditioning and keep windows closed Avoid exposure to decaying vegetation. Avoid leaf raking. Avoid grain handling. Consider wearing a face mask if working in moldy areas.    Indoor (Perennial) Mold Control   Positive indoor molds via skin testing: Aspergillus, Penicillium, Fusarium, Aureobasidium (Pullulara), and Rhizopus  Maintain humidity below 50%. Clean washable surfaces with 5% bleach solution. Remove sources e.g. contaminated carpets.    Control of Dog or Cat Allergen  Avoidance is the best way to manage a dog or cat allergy. If you have a dog or cat and are allergic to dog or cats, consider removing the dog or cat from the home. If you have a dog or cat but don't want to find it a new home, or if your family wants a pet even though someone in the household is allergic, here are some strategies that may help keep symptoms at bay:  Keep the pet out of your bedroom and restrict it to only a few rooms. Be advised that keeping the dog or cat in only one room will not limit the allergens to that room. Don't pet, hug or kiss the dog or cat; if you do, wash your hands with soap and water. High-efficiency particulate air (HEPA) cleaners run continuously in a bedroom or living room can reduce allergen  levels over time. Regular use of a high-efficiency vacuum cleaner or a central vacuum can reduce allergen levels. Giving your dog or cat a bath at least once a week can reduce airborne allergen.   Control of Cockroach Allergen  Cockroach allergen has been identified as an important cause of acute attacks of asthma,  especially in urban settings.  There are fifty-five species of cockroach that exist in the [place], however only three, the [place], [place] species produce allergen that can affect patients with Asthma.  Allergens can be obtained from fecal particles, egg casings and secretions from cockroaches.    Remove food sources. Reduce access to water. Seal access and entry points. Spray runways with 0.5-1% Diazinon or Chlorpyrifos Blow boric acid power under stoves and refrigerator. Place bait stations (hydramethylnon) at feeding sites.  Control of Dust Mite Allergen    Dust mites play a major role in allergic asthma and rhinitis.  They occur in environments with high humidity wherever human skin is found.  Dust mites absorb humidity from the atmosphere (ie, they do not drink) and feed on organic matter (including shed human and animal skin).  Dust mites are a microscopic type of insect that you cannot see with the naked eye.  High levels of dust mites have been detected from mattresses, pillows, carpets, upholstered furniture, bed covers, clothes, soft toys and any woven material.  The principal allergen of the dust mite is found in its feces.  A gram of dust may contain 1,000 mites and 250,000 fecal particles.  Mite antigen is easily measured in the air during house cleaning activities.  Dust mites do not bite and do not cause harm to humans, other than by triggering allergies/asthma.    Ways to decrease your exposure to dust mites in your home:  Encase mattresses, box springs and pillows with a mite-impermeable barrier or cover   Wash sheets, blankets and drapes weekly in hot water (130 F) with detergent and dry them in a dryer on the hot setting.  Have the room cleaned frequently with a vacuum cleaner and a damp dust-mop.  For carpeting or rugs, vacuuming with a vacuum cleaner equipped with a high-efficiency particulate air (HEPA) filter.  The dust mite allergic individual should  not be in a room which is being cleaned and should wait 1 hour after cleaning before going into the room. Do not sleep on upholstered furniture (eg, couches).   If possible removing carpeting, upholstered furniture and drapery from the home is ideal.  Horizontal blinds should be eliminated in the rooms where the person spends the most time (bedroom, study, television room).  Washable vinyl, roller-type shades are optimal. Remove all non-washable stuffed toys from the bedroom.  Wash stuffed toys weekly like sheets and blankets above.   Reduce indoor humidity to less than 50%.  Inexpensive humidity monitors can be purchased at most hardware stores.  Do not use a humidifier as can make the problem worse and are not recommended.  Allergy Shots   Allergies are the result of a chain reaction that starts in the immune system. Your immune system controls how your body defends itself. For instance, if you have an allergy to pollen, your immune system identifies pollen as an invader or allergen. Your immune system overreacts by producing antibodies called Immunoglobulin E (IgE). These antibodies travel to cells that release chemicals, causing an allergic reaction.  The concept behind allergy immunotherapy, whether it is received in the form of shots or tablets,  is that the immune system can be desensitized to specific allergens that trigger allergy symptoms. Although it requires time and patience, the payback can be long-term relief.  How Do Allergy Shots Work?  Allergy shots work much like a vaccine. Your body responds to injected amounts of a particular allergen given in increasing doses, eventually developing a resistance and tolerance to it. Allergy shots can lead to decreased, minimal or no allergy symptoms.  There generally are two phases: build-up and maintenance. Build-up often ranges from three to six months and involves receiving injections with increasing amounts of the allergens. The shots are  typically given once or twice a week, though more rapid build-up schedules are sometimes used.  The maintenance phase begins when the most effective dose is reached. This dose is different for each person, depending on how allergic you are and your response to the build-up injections. Once the maintenance dose is reached, there are longer periods between injections, typically two to four weeks.  Occasionally doctors give cortisone-type shots that can temporarily reduce allergy symptoms. These types of shots are different and should not be confused with allergy immunotherapy shots.  Who Can Be Treated with Allergy Shots?  Allergy shots may be a good treatment approach for people with allergic rhinitis (hay fever), allergic asthma, conjunctivitis (eye allergy) or stinging insect allergy.   Before deciding to begin allergy shots, you should consider:   The length of allergy season and the severity of your symptoms  Whether medications and/or changes to your environment can control your symptoms  Your desire to avoid long-term medication use  Time: allergy immunotherapy requires a major time commitment  Cost: may vary depending on your insurance coverage  Allergy shots for children age 34 and older are effective and often well tolerated. They might prevent the onset of new allergen sensitivities or the progression to asthma.  Allergy shots are not started on patients who are pregnant but can be continued on patients who become pregnant while receiving them. In some patients with other medical conditions or who take certain common medications, allergy shots may be of risk. It is important to mention other medications you talk to your allergist.   When Will I Feel Better?  Some may experience decreased allergy symptoms during the build-up phase. For others, it may take as long as 12 months on the maintenance dose. If there is no improvement after a year of maintenance, your allergist will discuss  other treatment options with you.  If you aren't responding to allergy shots, it may be because there is not enough dose of the allergen in your vaccine or there are missing allergens that were not identified during your allergy testing. Other reasons could be that there are high levels of the allergen in your environment or major exposure to non-allergic triggers like tobacco smoke.  What Is the Length of Treatment?  Once the maintenance dose is reached, allergy shots are generally continued for three to five years. The decision to stop should be discussed with your allergist at that time. Some people may experience a permanent reduction of allergy symptoms. Others may relapse and a longer course of allergy shots can be considered.  What Are the Possible Reactions?  The two types of adverse reactions that can occur with allergy shots are local and systemic. Common local reactions include very mild redness and swelling at the injection site, which can happen immediately or several hours after. A systemic reaction, which is less common, affects the entire  body or a particular body system. They are usually mild and typically respond quickly to medications. Signs include increased allergy symptoms such as sneezing, a stuffy nose or hives.  Rarely, a serious systemic reaction called anaphylaxis can develop. Symptoms include swelling in the throat, wheezing, a feeling of tightness in the chest, nausea or dizziness. Most serious systemic reactions develop within 30 minutes of allergy shots. This is why it is strongly recommended you wait in your doctor's office for 30 minutes after your injections. Your allergist is trained to watch for reactions, and his or her staff is trained and equipped with the proper medications to identify and treat them.  Who Should Administer Allergy Shots?  The preferred location for receiving shots is your prescribing allergist's office. Injections can sometimes be given at  another facility where the physician and staff are trained to recognize and treat reactions, and have received instructions by your prescribing allergist.

## 2021-05-05 NOTE — Progress Notes (Signed)
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 Seasonal and perennial allergic rhinitis - Testing today showed: ragweed, trees, indoor molds, outdoor molds, dust mites, cat, dog, and cockroach. - Copy of test results provided.  - Avoidance measures provided. - Stop taking: loratadine - Start taking: Zyrtec (cetirizine) 10mg  tablet once daily (this is slightly stronger than the loratadine and might provide some more help) - You can use an extra dose of the antihistamine, if needed, for breakthrough symptoms.  - Consider nasal saline rinses 1-2 times daily to remove allergens from the nasal cavities as well as help with mucous clearance (this is especially helpful to do before the nasal sprays are given) - Consider allergy shots as a means of long-term control. - Allergy shots "re-train" and "reset" the immune system to ignore environmental allergens and decrease the resulting immune response to those allergens (sneezing, itchy watery eyes, runny nose, nasal congestion, etc).    - Allergy shots improve symptoms in 75-85% of patients.  - We can discuss more at the next appointment if the medications are not working for you.  2. Eosinophilia - We are going to trend this to see where the numbers are heading. - I do not think that we need to do a work-up for hypereosinophilic syndrome at this point. - Your testing today does indicate a lot of environmental allergies, which can certainly lead to elevated eosinophils. - We are going to get your results from your lung biopsy,  which they hopefully saved. - Histoplasmosis can cause elevated eosinophils, so we may consider another chest x-ray just to see if any new spots are appearing. - I do NOT think that this is likely.  3. Return in about 2 months (around 07/05/2021).       This note in its entirety was forwarded to the Provider who requested this consultation.  Subjective:   Kathy Stephenson is a 75 y.o. female presenting today for evaluation of  Chief Complaint  Patient presents with   Allergy Testing    Milcah L Si has a history of the following: Patient Active Problem List   Diagnosis Date Noted   Seasonal and perennial allergic rhinitis 05/05/2021   Eosinophilia 05/05/2021   Food intolerance 05/05/2021     History obtained from: chart review and patient.  Peola L Arpino was referred by Celene Squibb, MD.     Karalina is a 75 y.o. female presenting for an evaluation of eosinophilia .  She was noted to have to elevated eosinophils most recently on a routine CBC with an AEC 1400.    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 This was only within the pleura. She had some ribs taken out during that process as well. She has had no coughing, chest pain, and night sweats. She grew up with pigeons and chickens all of her life.   She lived in [place] and [place] which is where she picked this up. This was a randomly picked up lesion on a CXR. She had a chest CT three over a year  and they could not determine what it was. This was done in [place], [place]. She has not had a CXR since that time.   Allergic Rhinitis Symptom History: She does have some intermittent allergic rhinitis symptoms in the fall season. She does take loratadine throughout the year. This is mostly to control dizziness.  She started taking it year round around five years ago. She takes fluticasone daily throughout the year. This also helps to keep her  Eustachian tube open. She does get her ears cleaned out by Dr. Brien Mates in [place]. 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 She does help with the ear dizziness as well.   She does have palpitations at baseline and has been on atenolol for years. She has had a cardiac workup on a number of occasions. She had this two years ago. She sees a Cardiologist Levell July NP, last visit in July 2022).   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 This started ten years ago. She bought some honeycomb and she ate some. She started having itching in her throat and she treated with Benadryl. She did have testing a few years later from her PCP and they could not "tell whether [she] was allergic".  She started having issues with peanuts around a couple of months ago. She was eating a lot of them and she started to get nauseous with diarrhea and emesis. She also has some issues with diet sodas. She has been avoiding both to keep things stable. She has been continuing to eat tree nuts including pecans and walnuts and almonds without a problem. She has not had cashews or pistachios lately but typically she tolerates these without a problem.   Skin Symptom History: She has had some rashes when she is exposed to too much gluten. She has swelling and itching on her abdomen which she treats with lavender.  Otherwise, there is no history of other atopic diseases, including asthma, drug allergies, stinging insect allergies, or contact dermatitis. There is no significant infectious history. Vaccinations are up to date.    Past Medical History: Patient Active Problem List   Diagnosis Date Noted   Seasonal and perennial allergic rhinitis 05/05/2021   Eosinophilia 05/05/2021   Food intolerance 05/05/2021     Medication List:  Allergies as of 05/05/2021       Reactions   Aspartame And Phenylalanine Diarrhea, Nausea And Vomiting    Celestone [betamethasone] Other (See Comments)   Raised  red spots on face and cheek of bottom   Ciprofloxacin Other (See Comments)   Increases heart rate   Nitrofurantoin Hives   Other Itching   HONEY - SCRATCHY THROAT   Penicillins Hives   Sulfa Antibiotics    Chest tightness        Medication List        Accurate as of May 05, 2021  2:55 PM. If you have any questions, ask your nurse or doctor.          STOP taking these medications    atenolol 25 MG tablet Commonly known as: TENORMIN Stopped by: Valentina Shaggy, MD       TAKE these medications    ALPRAZolam 0.5 MG tablet Commonly known as: XANAX Take 0.25 mg by mouth at bedtime as needed for anxiety (When fly).   BLACK CURRANT SEED OIL PO Take 1 each by mouth daily.   CAL-MAG PO  Take 1 tablet by mouth daily.   calcium carbonate 1500 (600 Ca) MG Tabs tablet Commonly known as: OSCAL Take by mouth daily.   co-enzyme Q-10 30 MG capsule Take 100 mg by mouth daily.   ELDERBERRY PO Take 2,000 mg by mouth daily.   FISH OIL-KRILL OIL PO Take 1 each by mouth daily.   ibuprofen 200 MG tablet Commonly known as: ADVIL Take 200 mg by mouth every 6 (six) hours as needed for mild pain or moderate pain.   levothyroxine 88 MCG tablet Commonly known as: SYNTHROID Take 88 mcg by mouth daily before breakfast.   loratadine 10 MG tablet Commonly known as: CLARITIN Take 10 mg by mouth daily.   multivitamin with minerals tablet Take 1 tablet by mouth daily.   PRO-BIOTIC BLEND PO Take 1 each by mouth daily.   rosuvastatin 5 MG tablet Commonly known as: CRESTOR Take 5 mg by mouth once a week.   TURMERIC PO Take 1 each by mouth daily.   vitamin A 25000 UNIT capsule Take 25,000 Units by mouth daily.   vitamin C 500 MG tablet Commonly known as: ASCORBIC ACID Take 1,000 mg by mouth daily. Chewable   Vitamin D-3 125 MCG (5000 UT) Tabs Take 5,000 Units by mouth daily.   vitamin E 180 MG (400  UNITS) capsule Take 400 Units by mouth daily.   VITAMIN K2 PO Take 1 tablet by mouth daily.   zinc gluconate 50 MG tablet Take 50 mg by mouth daily.        Birth History: non-contributory  Developmental History: non-contributory  Past Surgical History: Past Surgical History:  Procedure Laterality Date   BIOPSY  04/22/2021   Procedure: BIOPSY;  Surgeon: Rogene Houston, MD;  [facility];  Service: Endoscopy;;   BREAST BIOPSY     cataract Bilateral 2006   2001   COLONOSCOPY N/A 04/22/2021   Procedure: COLONOSCOPY;  Surgeon: Rogene Houston, MD;  [facility];  Service: Endoscopy;  Laterality: N/A;  10:30   OTHER SURGICAL HISTORY     LEFT THORACOTOMY/BTL MENSICUS TEAR REPAIRS    TUBAL LIGATION       Family History: Family History  Problem Relation Age of Onset   Diabetes Mother    Dementia Mother    Hypertension Father    Stroke Father    Allergic rhinitis Brother    Allergic rhinitis Brother    Allergic rhinitis Brother      Social History: Geisha lives at home with husband, who brought her here to [place].  Lives in a house that is 74 years old.  There is carpeting throughout the home.  She has a heat pump as well as electric heating.  There are central cooling.  There are no animals inside or outside of the home.  There are no dust mite covers on the bedding.  There is no tobacco exposure.  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  She works from home.  There is no fume, chemical, or dust exposure.  She does use a HEPA filter.  She does not live near an interstate or industrial area.  She was a smoker from 80 through 1983.   Review of Systems  Constitutional:  Negative for chills, fever, malaise/fatigue and weight loss.  HENT: Negative.  Negative for congestion, ear discharge and ear pain.   Eyes:  Negative for pain, discharge and redness.  Respiratory:  Negative for cough,  sputum production,  shortness of breath and wheezing.   Cardiovascular: Negative.  Negative for chest pain and palpitations.  Gastrointestinal:  Negative for abdominal pain, constipation, diarrhea, heartburn, nausea and vomiting.  Skin: Negative.  Negative for itching and rash.  Neurological:  Negative for dizziness and headaches.  Endo/Heme/Allergies:  Positive for environmental allergies. Does not bruise/bleed easily.      Objective:   Blood pressure 122/82, pulse 65, temperature (!) 97.3 F (36.3 C), temperature source Temporal, resp. rate 18, height 5' 6.5" (1.689 m), weight 182 lb 6.4 oz (82.7 kg), SpO2 97 %. Body mass index is 29 kg/m.   Physical Exam:   Physical Exam Vitals reviewed.  Constitutional:      Appearance: She is well-developed.  HENT:     Head: Normocephalic and atraumatic.     Right Ear: Tympanic membrane, ear canal and external ear normal. No drainage, swelling or tenderness. Tympanic membrane is not injected, scarred, erythematous, retracted or bulging.     Left Ear: Tympanic membrane, ear canal and external ear normal. No drainage, swelling or tenderness. Tympanic membrane is not injected, scarred, erythematous, retracted or bulging.     Nose: No nasal deformity, septal deviation, mucosal edema or rhinorrhea.     Right Turbinates: Enlarged, swollen and pale.     Left Turbinates: Enlarged, swollen and pale.     Right Sinus: No maxillary sinus tenderness or frontal sinus tenderness.     Left Sinus: No maxillary sinus tenderness or frontal sinus tenderness.     Mouth/Throat:     Lips: Pink.     Mouth: Mucous membranes are moist. Mucous membranes are not pale and not dry.     Pharynx: Uvula midline.     Comments: Cobblestoning in the posterior oropharynx.  Eyes:     General:        Right eye: No discharge.        Left eye: No discharge.     Conjunctiva/sclera: Conjunctivae normal.     Right eye: Right conjunctiva is not injected. No chemosis.    Left eye: Left conjunctiva  is not injected. No chemosis.    Pupils: Pupils are equal, round, and reactive to light.  Cardiovascular:     Rate and Rhythm: Normal rate and regular rhythm.     Heart sounds: Normal heart sounds.  Pulmonary:     Effort: Pulmonary effort is normal. No tachypnea, accessory muscle usage or respiratory distress.     Breath sounds: Normal breath sounds. No wheezing, rhonchi or rales.     Comments: Moving air well in all lung fields. No increased work of breathing noted.  Chest:     Chest wall: No tenderness.  Abdominal:     Tenderness: There is no abdominal tenderness. There is no guarding or rebound.  Lymphadenopathy:     Head:     Right side of head: No submandibular, tonsillar or occipital adenopathy.     Left side of head: No submandibular, tonsillar or occipital adenopathy.     Cervical: No cervical adenopathy.  Skin:    General: Skin is warm.     Capillary Refill: Capillary refill takes less than 2 seconds.     Coloration: Skin is not pale.     Findings: No abrasion, erythema, petechiae or rash. Rash is not papular, urticarial or vesicular.  Neurological:     Mental Status: She is alert.  Psychiatric:        Behavior: Behavior is cooperative.     Diagnostic studies:  Allergy Studies:     Airborne Adult Perc - 05/05/21 0922     Time Antigen Placed 3716    Allergen Manufacturer Lavella Hammock    Location Back    Number of Test 59    Panel 1 Select    1. Control-Buffer 50% Glycerol Negative    2. Control-Histamine 1 mg/ml 2+    3. Albumin saline Negative    4. Watersmeet Negative    5. [place] Negative    6. Johnson Negative    7. Hillside Blue Negative    8. Meadow Fescue Negative    9. Perennial Rye Negative    10. Sweet Vernal Negative    11. Timothy Negative    12. Cocklebur Negative    13. Burweed Marshelder Negative    14. Ragweed, short Negative    15. Ragweed, Giant Negative    16. Plantain,  English Negative    17. Lamb's Quarters Negative    18. Sheep Sorrell  Negative    19. Rough Pigweed Negative    20. Marsh Elder, Rough Negative    21. Mugwort, Common Negative    22. Ash mix Negative    23. Birch mix Negative    24. Beech American Negative    25. Box, Elder Negative    26. Cedar, red Negative    27. Cottonwood, [place] Negative    28. Elm mix Negative    29. Hickory Negative    30. Maple mix Negative    31. Oak, [place] mix Negative    32. Pecan Pollen Negative    33. Pine mix Negative    34. Sycamore Eastern Negative    35. [place], Black Pollen Negative    36. Alternaria alternata Negative    37. Cladosporium Herbarum Negative    38. Aspergillus mix Negative    39. Penicillium mix Negative    40. Bipolaris sorokiniana (Helminthosporium) Negative    41. Drechslera spicifera (Curvularia) Negative    42. Mucor plumbeus Negative    43. Fusarium moniliforme Negative    44. Aureobasidium pullulans (pullulara) Negative    45. Rhizopus oryzae Negative    46. Botrytis cinera Negative    47. Epicoccum nigrum Negative    48. Phoma betae Negative    49. Candida Albicans Negative    50. Trichophyton mentagrophytes Negative    51. Mite, D Farinae  5,000 AU/ml Negative    52. Mite, D Pteronyssinus  5,000 AU/ml Negative    53. Cat Hair 10,000 BAU/ml Negative    54.  Dog Epithelia Negative    55. Mixed Feathers Negative    56. Horse Epithelia Negative    57. Cockroach, German Negative    58. Mouse Negative    59. Tobacco Leaf Negative             Intradermal - 05/05/21 0954     Time Antigen Placed 9678    Allergen Manufacturer Lavella Hammock    Location Arm    Number of Test 15    Intradermal Select    Control Negative    Guatemala Negative    Johnson Negative    7 Grass Negative    Ragweed mix 1+    Weed mix Negative    Tree mix 2+    Mold 1 3+    Mold 2 3+    Mold 3 3+    Mold 4 3+    Cat 2+    Dog 3+    Cockroach 1+    Mite  mix 4+             Food Adult Perc - 05/05/21 0900     Time Antigen Placed 7185    Allergen  Manufacturer Lavella Hammock    Location Back    Number of allergen test 17    1. Peanut Negative    2. Soybean Negative    3. Wheat Negative    4. Sesame Negative    5. Milk, cow Negative    6. Egg White, Chicken Negative    7. Casein Negative    8. Shellfish Mix Negative    9. Fish Mix Negative    10. Cashew Negative    11. Pecan Food Negative    12. Huson Negative    13. Almond Negative    14. Hazelnut Negative    15. [place] nut Negative    16. Coconut Negative    17. Pistachio Negative             Allergy testing results were read and interpreted by myself, documented by clinical staff.         Salvatore Marvel, MD Allergy and [place] [facility] [place]

## 2021-05-12 ENCOUNTER — Other Ambulatory Visit: Payer: Self-pay | Admitting: Allergy & Immunology

## 2021-05-13 LAB — CBC WITH DIFFERENTIAL
Basophils Absolute: 0.1 10*3/uL (ref 0.0–0.2)
Basos: 1 %
EOS (ABSOLUTE): 0.3 10*3/uL (ref 0.0–0.4)
Eos: 3 %
Hematocrit: 40.3 % (ref 34.0–46.6)
Hemoglobin: 13.8 g/dL (ref 11.1–15.9)
Immature Grans (Abs): 0 10*3/uL (ref 0.0–0.1)
Immature Granulocytes: 0 %
Lymphocytes Absolute: 3.4 10*3/uL — ABNORMAL HIGH (ref 0.7–3.1)
Lymphs: 37 %
MCH: 30.5 pg (ref 26.6–33.0)
MCHC: 34.2 g/dL (ref 31.5–35.7)
MCV: 89 fL (ref 79–97)
Monocytes Absolute: 0.8 10*3/uL (ref 0.1–0.9)
Monocytes: 8 %
Neutrophils Absolute: 4.7 10*3/uL (ref 1.4–7.0)
Neutrophils: 51 %
RBC: 4.52 x10E6/uL (ref 3.77–5.28)
RDW: 13.2 % (ref 11.7–15.4)
WBC: 9.3 10*3/uL (ref 3.4–10.8)

## 2021-05-14 ENCOUNTER — Encounter: Payer: Self-pay | Admitting: Allergy & Immunology

## 2021-05-24 NOTE — Telephone Encounter (Signed)
Dr. Ernst Bowler did you ever receive patients records. Please advise.

## 2021-05-25 NOTE — Telephone Encounter (Signed)
Medical records request is in [place], will refax tomorrow with pathology request.

## 2021-05-25 NOTE — Telephone Encounter (Signed)
We got a fax back saying that they never had a lung biopsy.  I wonder if we needed to be more broadly just include all pathology?  So we might need to resubmit.  Salvatore Marvel, MD Allergy and [place] [facility] [place]

## 2021-05-26 NOTE — Telephone Encounter (Signed)
Received a fax back stating that there were no pathology records. Sorry Dr. Ernst Bowler.

## 2021-05-26 NOTE — Telephone Encounter (Signed)
Medical records request has been re-faxed requesting all pathology results.

## 2021-05-27 NOTE — Telephone Encounter (Signed)
No problem - it does not change our current management. Not a problem! They might have just thrown it away.   Salvatore Marvel, MD Allergy and [place] [facility] [place]

## 2021-06-07 ENCOUNTER — Encounter: Payer: Self-pay | Admitting: Internal Medicine

## 2021-07-09 ENCOUNTER — Ambulatory Visit: Payer: Medicare Other | Admitting: Allergy & Immunology

## 2021-07-16 ENCOUNTER — Encounter: Payer: Self-pay | Admitting: Allergy & Immunology

## 2021-07-16 ENCOUNTER — Other Ambulatory Visit: Payer: Self-pay

## 2021-07-16 ENCOUNTER — Ambulatory Visit (INDEPENDENT_AMBULATORY_CARE_PROVIDER_SITE_OTHER): Payer: Medicare Other | Admitting: Allergy & Immunology

## 2021-07-16 VITALS — BP 122/68 | HR 96 | Resp 16

## 2021-07-16 DIAGNOSIS — D721 Eosinophilia, unspecified: Secondary | ICD-10-CM | POA: Diagnosis not present

## 2021-07-16 DIAGNOSIS — J302 Other seasonal allergic rhinitis: Secondary | ICD-10-CM

## 2021-07-16 DIAGNOSIS — K9049 Malabsorption due to intolerance, not elsewhere classified: Secondary | ICD-10-CM

## 2021-07-16 DIAGNOSIS — J3089 Other allergic rhinitis: Secondary | ICD-10-CM

## 2021-07-16 NOTE — Patient Instructions (Addendum)
1. Seasonal and perennial allergic rhinitis (ragweed, trees, indoor molds, outdoor molds, dust mites, cat, dog, and cockroach) - Continue taking: Claritin (loratadine) 10mg  tablet OR Zyrtec (cetirizine) once daily as needed and Flonase (fluticasone) one spray per nostril as needed.  - You can try adding Zatidor or Pazeo for eye drops as needed.  - We are going to hold off on allergy shots for now since you seem to have your symptoms under control.   2. Eosinophilia - We can continue to monitor this. - Your last level in December was 300, which is not bad at all.   3. Return in about 6 months (around 01/13/2022).    Please inform us of any Emergency Department visits, hospitalizations, or changes in symptoms. Call us before going to the ED for breathing or allergy symptoms since we might be able to fit you in for a sick visit. Feel free to contact us anytime with any questions, problems, or concerns.  It was a pleasure to see you again today! Good luck in [place] for your adventure!   Websites that have reliable patient information: 1. American Academy of Asthma, Allergy, and Immunology: www.aaaai.org 2. Food Allergy Research and Education (FARE): foodallergy.org 3. Mothers of Asthmatics: http://www.asthmacommunitynetwork.org 4. American College of Allergy, Asthma, and Immunology: www.acaai.org   COVID-19 Vaccine Information can be found at: ShippingScam.co.uk For questions related to vaccine distribution or appointments, please email [email or web address] or call [phone].   We realize that you might be concerned about having an allergic reaction to the COVID19 vaccines. To help with that concern, WE ARE OFFERING THE COVID19 VACCINES IN OUR OFFICE! Ask the front desk for dates!     Like [place] on [employer] and Instagram for our latest updates!      A healthy democracy works best when [employer] participate! Make sure you  are registered to vote! If you have moved or changed any of your contact information, you will need to get this updated before voting!  In some cases, you MAY be able to register to vote online: [email or web address]

## 2021-07-16 NOTE — Progress Notes (Signed)
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 Seasonal and perennial allergic rhinitis (ragweed, trees, indoor molds, outdoor molds, dust mites, cat, dog, and cockroach) - Continue taking: Claritin (loratadine) 10mg  tablet OR Zyrtec (cetirizine) once daily as needed and Flonase (fluticasone) one spray per nostril as needed.  - You can try adding Zatidor or Pazeo for eye drops as needed.  - We are going to hold off on allergy shots for now since you seem to have your symptoms under control.   2. Eosinophilia - We can continue to monitor this. - Your last level in December was 300, which is not bad at all.   3. Return in about 6 months (around 01/13/2022).    Subjective:   Kathy Stephenson is a 76 y.o. female presenting today for follow up of  Chief Complaint  Patient presents with   Allergic Rhinitis     Kathy Stephenson has a history of the following: Patient Active Problem List   Diagnosis Date Noted   Seasonal and perennial allergic rhinitis 05/05/2021   Eosinophilia 05/05/2021   Food intolerance 05/05/2021    History obtained from: chart review and patient.  Kathy Stephenson is a 76 y.o. female presenting for a follow up visit.  She was last seen in November 2022.  At that time, she had testing that was positive to multiple indoor allergens.  We stopped her loratadine and started Zyrtec.  We also discussed allergen immunotherapy.  For eosinophilia, we decided to trend these.  Since the last visit, she has mostly done well. She flew to see her son ins [place] for five days. She did have some issue with  exposure to her son's cat. She was off of her loratadine. She is generally good. She was exposed to a large white cat and she developed some issues with red eyes.   In general, she has mostly done well without the medications at all. She has been adding on the Claritin on a PRN basis. She has not been using any nose sprays at all. She does have some eye drops but these are mostly moisturizing eye drops.  I reviewed patient  Food Allergy Symptom History: She has noticed that she thinks that she is allergic to foods because she has constipation episodes and "poop sweats".  She is doing a probiotic.Her diet is clean in general. She did have a flare yesterday with some lower abdominal pain. She had a colonoscopy two months ago and they found a diverticuli. It has never been inflamed. She is just monitoring it. She has had no blood in her stools.   She and her husband are going to be going up to [place], [place] to watch her 2 grandkids there for 2 weeks.  Their mother (Joanne's daughter) and her husband are flying to [facility] to look for a house.  He is going to be transferred there.  He currently works at the [employer] in [place], [place] but is going to be done in [place] for 1 to 2 years.    Otherwise, there have been no changes to her past medical history, surgical history, family history,  or social history.    Review of Systems  Constitutional: Negative.  Negative for chills, fever, malaise/fatigue and weight loss.  HENT: Negative.  Negative for congestion, ear discharge, ear pain and sinus pain.   Eyes:  Negative for pain, discharge and redness.  Respiratory:  Negative for cough, sputum production, shortness of breath, wheezing and stridor.   Cardiovascular: Negative.  Negative for chest pain and palpitations.  Gastrointestinal:  Negative for abdominal pain, constipation, diarrhea, heartburn, nausea and vomiting.  Skin: Negative.  Negative for itching and rash.  Neurological:  Negative  for dizziness and headaches.  Endo/Heme/Allergies:  Negative for environmental allergies. Does not bruise/bleed easily.      Objective:   Blood pressure 122/68, pulse 96, resp. rate 16, SpO2 96 %. There is no height or weight on file to calculate BMI.   Physical Exam:  Physical Exam Vitals reviewed.  Constitutional:      Appearance: She is well-developed.  HENT:     Head: Normocephalic and atraumatic.     Right Ear: Tympanic membrane, ear canal and external ear normal.     Left Ear: Tympanic membrane, ear canal and external ear normal.     Nose: No nasal deformity, septal deviation, mucosal edema or rhinorrhea.     Right Turbinates: Enlarged and swollen. Not pale.     Left Turbinates: Enlarged and swollen. Not pale.     Right Sinus: No maxillary sinus tenderness or frontal sinus tenderness.     Left Sinus: No maxillary sinus tenderness or frontal sinus tenderness.     Mouth/Throat:     Mouth: Mucous membranes are not pale and not dry.     Pharynx: Uvula midline.     Comments: Cobblestoning in the posterior oropharynx.  Eyes:     General: Lids are normal. No allergic shiner.       Right eye: No discharge.        Left eye: No discharge.     Conjunctiva/sclera: Conjunctivae normal.     Right eye: Right conjunctiva is not injected. No chemosis.    Left eye: Left conjunctiva is not injected. No chemosis.    Pupils: Pupils are equal, round, and reactive to light.  Cardiovascular:     Rate and Rhythm: Normal rate and regular rhythm.     Heart sounds: Normal heart sounds.  Pulmonary:     Effort: Pulmonary effort is normal. No tachypnea, accessory muscle usage or respiratory distress.     Breath sounds: Normal breath sounds. No wheezing, rhonchi or rales.  Chest:     Chest wall: No tenderness.  Lymphadenopathy:     Cervical: No cervical adenopathy.  Skin:    Coloration: Skin is not pale.     Findings: No abrasion, erythema, petechiae or rash. Rash is not papular, urticarial  or vesicular.  Neurological:     Mental Status: She is alert.  Psychiatric:        Behavior: Behavior is cooperative.     Diagnostic studies: none      Salvatore Marvel, MD  [facility]

## 2021-12-30 ENCOUNTER — Telehealth: Payer: Self-pay | Admitting: Cardiology

## 2021-12-30 NOTE — Telephone Encounter (Signed)
   Pre-operative Risk Assessment    Patient Name: Kathy Stephenson  DOB: 05/19/1946 MRN: [id number]      Request for Surgical Clearance    Procedure:  Rt Partial Knee Arthroplasty   Date of Surgery:  Clearance 03/29/22                                 Surgeon:  Dr. Gaynelle Arabian Surgeon's Group or Practice Name:  [facility] Phone number:  [phone] Fax number:  [phone]   Type of Clearance Requested:   - Medical    Type of Anesthesia:   choice   Additional requests/questions:    Sandrea Hammond   12/30/2021, 8:37 AM .

## 2021-12-30 NOTE — Telephone Encounter (Signed)
   Name: Kathy Stephenson  DOB: 13-Oct-1945  MRN: [id number]  Primary Cardiologist: Rozann Lesches, MD  Chart reviewed as part of pre-operative protocol coverage. Because of Kathy Stephenson's past medical history and time since last visit, she will require a follow-up in-office visit in order to better assess preoperative cardiovascular risk.  Pre-op covering staff: - Please schedule appointment and call patient to inform them. If patient already had an upcoming appointment within acceptable timeframe, please add "pre-op clearance" to the appointment notes so provider is aware. - Please contact requesting surgeon's office via preferred method (i.e, phone, fax) to inform them of need for appointment prior to surgery.   Lenna Sciara, NP  12/30/2021, 4:47 PM

## 2021-12-31 NOTE — Telephone Encounter (Signed)
Pt has been scheduled to see Bernerd Pho, PA-C, 9/13.23, clearance will be addressed at that time.  Will route to requesting surgeon's office to make them aware.

## 2022-01-12 ENCOUNTER — Ambulatory Visit (HOSPITAL_BASED_OUTPATIENT_CLINIC_OR_DEPARTMENT_OTHER): Payer: Medicare Other | Admitting: Family

## 2022-01-14 ENCOUNTER — Ambulatory Visit (INDEPENDENT_AMBULATORY_CARE_PROVIDER_SITE_OTHER): Payer: Medicare Other | Admitting: Allergy & Immunology

## 2022-01-14 ENCOUNTER — Encounter: Payer: Self-pay | Admitting: Allergy & Immunology

## 2022-01-14 VITALS — BP 120/74 | HR 68 | Temp 97.1°F | Resp 18

## 2022-01-14 DIAGNOSIS — J3089 Other allergic rhinitis: Secondary | ICD-10-CM | POA: Diagnosis not present

## 2022-01-14 DIAGNOSIS — D721 Eosinophilia, unspecified: Secondary | ICD-10-CM | POA: Diagnosis not present

## 2022-01-14 DIAGNOSIS — J302 Other seasonal allergic rhinitis: Secondary | ICD-10-CM

## 2022-01-14 NOTE — Progress Notes (Signed)
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 Seasonal and perennial allergic rhinitis (ragweed, trees, indoor molds, outdoor molds, dust mites, cat, dog, and cockroach) - Continue taking: Claritin (loratadine) '10mg'$  tablet and Flonase (fluticasone) one spray per nostril as needed.  - You can increase the fluticasone.  - Continue with Systane eye drops as needed.  - We are going to hold off on allergy shots for now since you seem to have your symptoms under control.  - Ask Dr. Nevada Crane to send [place] the labs from September when you get them there in September.   2. Return in about 1 year (around 01/15/2023).    Subjective:   Kathy Stephenson is a 76 y.o. female presenting today for follow up of  Chief Complaint  Patient presents with   Follow-up    Kathy Stephenson has a history of the following: Patient Active Problem List   Diagnosis Date Noted   Seasonal and perennial allergic rhinitis 05/05/2021   Eosinophilia 05/05/2021   Food intolerance 05/05/2021    History obtained from: chart review and patient.  Kathy Stephenson is a 76 y.o. female presenting for a follow up visit.  She was last seen in February 2023.  At that time, we continue with Claritin or Zyrtec daily as needed and Flonase.  We recommended some eyedrops as needed as well.  We did not pursue allergen immunotherapy because her symptoms seem to be controlled.  For eosinophilia, we will just continue to monitor.  Since the last visit, she has done well. She is going to [place]. She is leaving Monday and she will be there for 8 days. She has two brothers  there and there is a class reunion coming up.   Allergic Rhinitis Symptom History: She has been doing well for the most part. She is doing loratadine once daily. She goes to see her son in [place] and has problems with stuffiness. She also has some redness.  She has some problems with ETD with the humidity. She does not do anything to get rid of it and it does away on its own. She uses salt water rinses and she does use fluticasone one spry per nostril once daily.   She has to have knee replacements First one is October 24th. Because she has had bilateral meninscus repairs, she is a candidate for partial replacements. Second surgery to be determined, but likely 12 weeks after the fact. She has no history of metal allergies t her knowledge. She has been going to therapy to strengthen her other muscles.   She is having a CBC at her next appointment with Dr. Nevada Crane. This is September 15th. We  can get the eosinophil count at that time. She has not had any GI symptoms at all or rashes since the last visit.,   She is generally doing well.  Her stepson and his family moved to [facility] have settled in nicely.  She recently had a birthday.  They went out to eat at [facility] Tuesday and eating and then actually went spending time with a friend who is sick in the hospital.  She is  traveling up to [place] this weekend after a short trip to [place] to see another son.  She is going to be going to a high school reunion in a small town on the [place].  She is worried about exposure to the [place] smoke and is wondering what I recommended about that.  Otherwise, there have been no changes to her past medical history, surgical history, family history, or social history.    Review of Systems  Constitutional: Negative.  Negative for fever, malaise/fatigue and weight loss.  HENT:  Positive for congestion. Negative for ear discharge and ear pain.   Eyes:  Negative for pain, discharge and  redness.  Respiratory:  Negative for cough, sputum production, shortness of breath and wheezing.   Cardiovascular: Negative.  Negative for chest pain and palpitations.  Gastrointestinal:  Negative for abdominal pain, heartburn, nausea and vomiting.  Skin: Negative.  Negative for itching and rash.  Neurological:  Negative for dizziness and headaches.  Endo/Heme/Allergies:  Negative for environmental allergies. Does not bruise/bleed easily.       Objective:   Blood pressure 120/74, pulse 68, temperature (!) 97.1 F (36.2 C), temperature source Temporal, resp. rate 18, SpO2 94 %. There is no height or weight on file to calculate BMI.    Physical Exam Vitals reviewed.  Constitutional:      Appearance: She is well-developed.  HENT:     Head: Normocephalic and atraumatic.     Right Ear: Tympanic membrane, ear canal and external ear normal.     Left Ear: Tympanic membrane, ear canal and external ear normal.     Nose: Rhinorrhea present. No nasal deformity, septal deviation or mucosal edema.     Right Turbinates: Enlarged and swollen. Not pale.     Left Turbinates: Enlarged and swollen. Not pale.     Right Sinus: No maxillary sinus tenderness or frontal sinus tenderness.     Left Sinus: No maxillary sinus tenderness or frontal sinus tenderness.     Mouth/Throat:     Mouth: Mucous membranes are not pale and not dry.     Pharynx: Uvula midline.     Comments: Cobblestoning in the posterior oropharynx.  Eyes:     General: Lids are normal. Allergic shiner present.        Right eye: No discharge.        Left eye: No discharge.     Conjunctiva/sclera: Conjunctivae normal.     Right eye: Right conjunctiva is not injected. No chemosis.    Left eye: Left conjunctiva is not injected. No chemosis.    Pupils: Pupils are equal, round, and reactive to light.  Cardiovascular:     Rate and Rhythm: Normal rate and regular rhythm.     Heart sounds: Normal heart sounds.  Pulmonary:     Effort:  Pulmonary effort is normal. No tachypnea, accessory muscle usage or respiratory distress.     Breath sounds: Normal breath sounds. No wheezing, rhonchi or rales.     Comments: Moving air well in all lung fields. No increased work of breathing noted.  Chest:     Chest wall: No tenderness.  Lymphadenopathy:     Cervical: No cervical adenopathy.  Skin:    Coloration: Skin is not pale.     Findings: No abrasion, erythema, petechiae or rash. Rash is not papular, urticarial or vesicular.  Neurological:     Mental Status: She is alert.  Psychiatric:        Behavior: Behavior  is cooperative.      Diagnostic studies: none      Salvatore Marvel, MD  [facility]

## 2022-01-14 NOTE — Patient Instructions (Addendum)
1. Seasonal and perennial allergic rhinitis (ragweed, trees, indoor molds, outdoor molds, dust mites, cat, dog, and cockroach) - Continue taking: Claritin (loratadine) '10mg'$  tablet and Flonase (fluticasone) one spray per nostril as needed.  - You can increase the fluticasone.  - Continue with Systane eye drops as needed.  - We are going to hold off on allergy shots for now since you seem to have your symptoms under control.  - Ask Dr. Nevada Crane to send [place] the labs from September when you get them there in September.   2. Return in about 1 year (around 01/15/2023).    Please inform us of any Emergency Department visits, hospitalizations, or changes in symptoms. Call us before going to the ED for breathing or allergy symptoms since we might be able to fit you in for a sick visit. Feel free to contact us anytime with any questions, problems, or concerns.  It was a pleasure to see you again today! Have fun in [place]!!   Websites that have reliable patient information: 1. American Academy of Asthma, Allergy, and Immunology: www.aaaai.org 2. Food Allergy Research and Education (FARE): foodallergy.org 3. Mothers of Asthmatics: http://www.asthmacommunitynetwork.org 4. American College of Allergy, Asthma, and Immunology: www.acaai.org   COVID-19 Vaccine Information can be found at: ShippingScam.co.uk For questions related to vaccine distribution or appointments, please email [email or web address] .[email or web address] or call [phone].   We realize that you might be concerned about having an allergic reaction to the COVID19 vaccines. To help with that concern, WE ARE OFFERING THE COVID19 VACCINES IN OUR OFFICE! Ask the front desk for dates!     "Like" [place] on Facebook and Instagram for our latest updates!      A healthy democracy works best when [employer] participate! Make sure you are registered to vote! If you have moved or changed any of your contact  information, you will need to get this updated before voting!  In some cases, you MAY be able to register to vote online: [email or web address]

## 2022-02-04 ENCOUNTER — Ambulatory Visit (INDEPENDENT_AMBULATORY_CARE_PROVIDER_SITE_OTHER): Payer: Medicare Other | Admitting: Family

## 2022-02-04 ENCOUNTER — Encounter (HOSPITAL_BASED_OUTPATIENT_CLINIC_OR_DEPARTMENT_OTHER): Payer: Self-pay | Admitting: Family

## 2022-02-04 VITALS — BP 134/76 | HR 69 | Ht 66.5 in | Wt 181.0 lb

## 2022-02-04 DIAGNOSIS — I2510 Atherosclerotic heart disease of native coronary artery without angina pectoris: Secondary | ICD-10-CM

## 2022-02-04 DIAGNOSIS — E785 Hyperlipidemia, unspecified: Secondary | ICD-10-CM | POA: Diagnosis not present

## 2022-02-04 DIAGNOSIS — R002 Palpitations: Secondary | ICD-10-CM

## 2022-02-04 DIAGNOSIS — Z01810 Encounter for preprocedural cardiovascular examination: Secondary | ICD-10-CM | POA: Diagnosis not present

## 2022-02-04 MED ORDER — ASPIRIN 81 MG PO TBEC: 81.0000 mg | DELAYED_RELEASE_TABLET | Freq: Every day | ORAL | 3 refills | Status: AC

## 2022-02-04 NOTE — Patient Instructions (Signed)
Medication Instructions:  Your physician has recommended you make the following change in your medication:    START Aspirin '81mg'$  one tablet  May stop 7 days prior to surgery  *If you need a refill on your cardiac medications before your next appointment, please call your pharmacy*   Lab Work: None ordered today.  We will request your  upcoming labs from your PCP.  Testing/Procedures: Your EKG shows normal sinus rhythm which is a good result!   Follow-Up: At [facility], you and your health needs are our priority.  As part of our continuing mission to provide you with exceptional heart care, we have created designated Provider Care Teams.  These Care Teams include your primary Cardiologist (physician) and Advanced Practice Providers (APPs -  Physician Assistants and Nurse Practitioners) who all work together to provide you with the care you need, when you need it.  We recommend signing up for the patient portal called "MyChart".  Sign up information is provided on this After Visit Summary.  MyChart is used to connect with patients for Virtual Visits (Telemedicine).  Patients are able to view lab/test results, encounter notes, upcoming appointments, etc.  Non-urgent messages can be sent to your provider as well.   To learn more about what you can do with MyChart, go to [email or web address].    Your next appointment:   1 year(s)  The format for your next appointment:   In Person  Provider:   You may see Rozann Lesches, MD or Advanced Practice Provider Other Instructions  Loel Dubonnet, NP will send a not to Dr. Maureen Ralphs that you are cleared for your surgery.   Heart Healthy Diet Recommendations: A low-salt diet is recommended. Meats should be grilled, baked, or boiled. Avoid fried foods. Focus on lean protein sources like fish or chicken with vegetables and fruits. The American Heart Association is a Microbiologist!  American Heart Association Diet and Lifeystyle  Recommendations   Exercise recommendations: The American Heart Association recommends 150 minutes of moderate intensity exercise weekly. Try 30 minutes of moderate intensity exercise 4-5 times per week. This could include walking, jogging, or swimming.

## 2022-02-04 NOTE — Progress Notes (Signed)
Office Visit    Patient Name: Kathy Stephenson Date of Encounter: 02/04/2022  PCP:  Celene Squibb, [facility]  Cardiologist:  Rozann Lesches, MD  Advanced Practice Provider:  No care team member to display Electrophysiologist:  None      Chief Complaint    Kathy Stephenson is a 76 y.o. female presents today for preop clearance   Past Medical History    Past Medical History:  Diagnosis Date   Anxiety    Hyperlipidemia    Hypothyroidism    Palpitations    Seasonal allergies    Urticaria    Past Surgical History:  Procedure Laterality Date   BIOPSY  04/22/2021   Procedure: BIOPSY;  Surgeon: Rogene Houston, MD;  Location[facility];  Service: Endoscopy;;   BREAST BIOPSY     cataract Bilateral 2006   2001   COLONOSCOPY N/A 04/22/2021   Procedure: COLONOSCOPY;  Surgeon: Rogene Houston, MD;  Location: [facility];  Service: Endoscopy;  Laterality: N/A;  10:30   OTHER SURGICAL HISTORY     LEFT THORACOTOMY/BTL MENSICUS TEAR REPAIRS    TUBAL LIGATION      Allergies  Allergies  Allergen Reactions   Aspartame And Phenylalanine Diarrhea and Nausea And Vomiting   Celestone [Betamethasone] Other (See Comments)    Raised  red spots on face and cheek of bottom   Ciprofloxacin Other (See Comments)    Increases heart rate   Nitrofurantoin Hives   Other Itching    HONEY - SCRATCHY THROAT   Penicillins Hives   Sulfa Antibiotics     Chest tightness    History of Present Illness    Kathy Stephenson is a 76 y.o. female with a hx of CAD, HLD, palpitations last seen 12/2020.  08/2019 coronary calscium score 78 placing her in 64th percentile for age and sex matched control.   Presents today for follow up independently. Reports no shortness of breath nor dyspnea on exertion. Reports no chest pain, pressure, or tightness. No edema, orthopnea, PND. Reports no palpitations on Atenolol. Exercises regularly by walking. Enjoys bird watching and  being outdoors.    EKGs/Labs/Other Studies Reviewed:   The following studies were reviewed today:  CT cardiac score 09/03/19 IMPRESSION: Coronary calcium score of 78. This was 64th percentile for age and sex matched control.  EKG:  EKG is  ordered today.  The ekg ordered today demonstrates NSR 69 bpm with no acute ST/T wave changes.   Recent Labs: 05/12/2021: Hemoglobin 13.8  Recent Lipid Panel No results found for: "CHOL", "TRIG", "HDL", "CHOLHDL", "VLDL", "LDLCALC", "LDLDIRECT"   Home Medications   Current Meds  Medication Sig   Berberine Chloride 500 MG CAPS Take 500 mg by mouth 2 (two) times daily.     Review of Systems      All other systems reviewed and are otherwise negative except as noted above.  Physical Exam    VS:  BP 134/76   Pulse 69   Ht 5' 6.5" (1.689 m)   Wt 181 lb (82.1 kg)   BMI 28.78 kg/m  , BMI Body mass index is 28.78 kg/m.  Wt Readings from Last 3 Encounters:  02/04/22 181 lb (82.1 kg)  05/05/21 182 lb 6.4 oz (82.7 kg)  04/22/21 176 lb (79.8 kg)     GEN: Well nourished, well developed, in no acute distress. HEENT: normal. Neck: Supple, no JVD, carotid bruits, or masses. Cardiac: RRR,  no murmurs, rubs, or gallops. No clubbing, cyanosis, edema.  Radials/PT 2+ and equal bilaterally.  Respiratory:  Respirations regular and unlabored, clear to auscultation bilaterally. GI: Soft, nontender, nondistended. MS: No deformity or atrophy. Skin: Warm and dry, no rash. Neuro:  Strength and sensation are intact. Psych: Normal affect.  Assessment & Plan    Preop - According to the Revised Cardiac Risk Index (RCRI), her Perioperative Risk of Major Cardiac Event is (%): 6.6. Her Functional Capacity in METs is: 8.23 according to the Duke Activity Status Index (DASI).  She is deemed acceptable risk for her planned procedure and may proceed without additional cardiovascular testing.  Will route to surgical team so they are aware.  She may hold aspirin 7 days  prior to her planned procedure and resume it is safe as deemed safe from surgical perspective postoperatively.  CAD - Nonobstructive by coronary calcium score. Stable with no anginal symptoms. No indication for ischemic evaluation.  GDMT rosuvastatin. Start aspirin for secondary prevention.   Palpitations -Quiescent on atenolol.  HLD - Continue Rosuvastatin. Upcoming lipid panel with PCP. Discussed LDL goal <70.       Disposition: Follow up in 1 year(s) with Rozann Lesches, MD or APP.  Signed, Loel Dubonnet, NP 02/04/2022, 3:07 PM Kathy Stephenson

## 2022-02-15 ENCOUNTER — Telehealth: Payer: Self-pay

## 2022-02-15 ENCOUNTER — Encounter: Payer: Self-pay | Admitting: Internal Medicine

## 2022-02-15 ENCOUNTER — Telehealth: Payer: Self-pay | Admitting: Allergy & Immunology

## 2022-02-15 MED ORDER — ROSUVASTATIN CALCIUM 5 MG PO TABS: 5.0000 mg | ORAL_TABLET | ORAL | 3 refills | Status: DC

## 2022-02-15 NOTE — Telephone Encounter (Signed)
-----   Message from Satira Sark, MD sent at 02/15/2022 11:39 AM EDT ----- Results reviewed.  Recent lipid panel showed LDL down to 74 which is much improved.  If she is tolerating Crestor 5 mg twice a week, could consider going to 3 times a week.

## 2022-02-15 NOTE — Telephone Encounter (Signed)
Patient states she already increased crestor to 3 x a week by pcp

## 2022-02-15 NOTE — Telephone Encounter (Signed)
Called and informed patient. Patient verbalized understanding.  

## 2022-02-15 NOTE — Telephone Encounter (Signed)
We got labs from [facility] PCP. Her complete blood count showed na AEC of 200. This is much better than it was when I first met her.   Can someone call to let the patient know this? No need for further labs at this point.   Salvatore Marvel, MD [facility]

## 2022-02-16 ENCOUNTER — Ambulatory Visit: Payer: Medicare Other | Admitting: Student

## 2022-03-18 ENCOUNTER — Encounter: Payer: Self-pay | Admitting: Internal Medicine

## 2022-03-21 ENCOUNTER — Ambulatory Visit: Payer: Medicare Other | Admitting: Cardiology

## 2022-05-12 ENCOUNTER — Telehealth: Payer: Self-pay | Admitting: *Deleted

## 2022-05-12 ENCOUNTER — Telehealth: Payer: Self-pay

## 2022-05-12 NOTE — Telephone Encounter (Signed)
Patient called and schedule for a telephone visit scheduled for 06/13/22 at 9 AM. Consent given and medications reviewed with patient. All (if any) questions were answered.

## 2022-05-12 NOTE — Telephone Encounter (Signed)
   Name: Kathy Stephenson  DOB: 08/20/1945  MRN: [id number]  Primary Cardiologist: Rozann Lesches, MD   Preoperative team, please contact this patient and set up a phone call appointment  on or after 06/10/2021 for further preoperative risk assessment. Please obtain consent and complete medication review. Thank you for your help.  I confirm that guidance regarding antiplatelet and oral anticoagulation therapy has been completed and, if necessary, noted below (none requested).    Lenna Sciara, NP 05/12/2022, 9:05 AM [place]

## 2022-05-12 NOTE — Telephone Encounter (Signed)
  Patient Consent for Virtual Visit        Kathy Stephenson has provided verbal consent on 05/12/2022 for a virtual visit (video or telephone).   CONSENT FOR VIRTUAL VISIT FOR:  Kathy Stephenson  By participating in this virtual visit I agree to the following:  I hereby voluntarily request, consent and authorize [facility] and its employed or contracted physicians, physician assistants, nurse practitioners or other licensed health care professionals (the Practitioner), to provide me with telemedicine health care services (the "Services") as deemed necessary by the treating Practitioner. I acknowledge and consent to receive the Services by the Practitioner via telemedicine. I understand that the telemedicine visit will involve communicating with the Practitioner through live audiovisual communication technology and the disclosure of certain medical information by electronic transmission. I acknowledge that I have been given the opportunity to request an in-person assessment or other available alternative prior to the telemedicine visit and am voluntarily participating in the telemedicine visit.  I understand that I have the right to withhold or withdraw my consent to the use of telemedicine in the course of my care at any time, without affecting my right to future care or treatment, and that the Practitioner or I may terminate the telemedicine visit at any time. I understand that I have the right to inspect all information obtained and/or recorded in the course of the telemedicine visit and may receive copies of available information for a reasonable fee.  I understand that some of the potential risks of receiving the Services via telemedicine include:  Delay or interruption in medical evaluation due to technological equipment failure or disruption; Information transmitted may not be sufficient (e.g. poor resolution of images) to allow for appropriate medical decision making by the  Practitioner; and/or  In rare instances, security protocols could fail, causing a breach of personal health information.  Furthermore, I acknowledge that it is my responsibility to provide information about my medical history, conditions and care that is complete and accurate to the best of my ability. I acknowledge that Practitioner's advice, recommendations, and/or decision may be based on factors not within their control, such as incomplete or inaccurate data provided by me or distortions of diagnostic images or specimens that may result from electronic transmissions. I understand that the practice of medicine is not an exact science and that Practitioner makes no warranties or guarantees regarding treatment outcomes. I acknowledge that a copy of this consent can be made available to me via my patient portal ([facility]), or I can request a printed copy by calling the office of [facility].    I understand that my insurance will be billed for this visit.   I have read or had this consent read to me. I understand the contents of this consent, which adequately explains the benefits and risks of the Services being provided via telemedicine.  I have been provided ample opportunity to ask questions regarding this consent and the Services and have had my questions answered to my satisfaction. I give my informed consent for the services to be provided through the use of telemedicine in my medical care

## 2022-05-12 NOTE — Telephone Encounter (Signed)
   Pre-operative Risk Assessment    Patient Name: Kathy Stephenson  DOB: 04/29/1946 MRN: [id number]      Request for Surgical Clearance    Procedure:   Rt knee unicompartmental arthroplasty  Date of Surgery:  Clearance 08/09/22                                 Surgeon:  Dr. Gaynelle Arabian Surgeon's Group or Practice Name:  [facility] Phone number:  [phone] Fax number:  [phone]   Type of Clearance Requested:   - Medical    Type of Anesthesia:   Choice anesthesia   Additional requests/questions:   Please fax back your recommendations to [facility]  Signed, Marlou Sa   05/12/2022, 7:24 AM

## 2022-06-13 ENCOUNTER — Ambulatory Visit: Payer: Medicare Other | Attending: Cardiology | Admitting: Physician Assistant

## 2022-06-13 DIAGNOSIS — Z01810 Encounter for preprocedural cardiovascular examination: Secondary | ICD-10-CM

## 2022-06-13 NOTE — Progress Notes (Signed)
Virtual Visit via Telephone Note   Because of Kathy Stephenson's co-morbid illnesses, she is at least at moderate risk for complications without adequate follow up.  This format is felt to be most appropriate for this patient at this time.  The patient did not have access to video technology/had technical difficulties with video requiring transitioning to audio format only (telephone).  All issues noted in this document were discussed and addressed.  No physical exam could be performed with this format.  Please refer to the patient's chart for her consent to telehealth for [facility]  Evaluation Performed:  Preoperative cardiovascular risk assessment _____________   Date:  06/13/2022   Patient ID:  Kathy Stephenson, DOB 1946-05-05, MRN [id number] Patient Location:  Home Provider location:   Office  Primary Care Provider:  Celene Squibb, MD Primary Cardiologist:  Rozann Lesches, MD  Chief Complaint / Patient Profile   77 y.o. y/o female with a h/o CAD, HLD, palpitations who is pending right knee unicompartmental arthoplasty (08/2022) and presents today for telephonic preoperative cardiovascular risk assessment.  History of Present Illness    Kathy Stephenson is a 77 y.o. female who presents via audio/video conferencing for a telehealth visit today.  Pt was last seen in cardiology clinic on 02/04/2022 by Kathy Montana, NP.  At that time Kathy Stephenson was doing well .  The patient is now pending procedure as outlined above. Since her last visit, she tells me that she has been feeling great.  She had her left knee done in October and she has been recovering from that.  She is released from physical therapy back in December.  She enjoys walking, no issues with stairs, she also likes gardening.  For this reason, she is scored a 5.62 METS on the DASI.  This exceeds the 4 METS minimum requirement.  Even though no medications are indicated as needing held she did mention that she  usually holds her aspirin for 7 days prior to the procedure and then after the procedure she started on a full aspirin a day.  She does have history of CAD but since she is asymptomatic at this time I think this would be okay to do.  Past Medical History    Past Medical History:  Diagnosis Date   Anxiety    Hyperlipidemia    Hypothyroidism    Palpitations    Seasonal allergies    Urticaria    Past Surgical History:  Procedure Laterality Date   BIOPSY  04/22/2021   Procedure: BIOPSY;  Surgeon: Rogene Houston, MD;  [facility]: [facility];  Service: Endoscopy;;   BREAST BIOPSY     cataract Bilateral 2006   2001   COLONOSCOPY N/A 04/22/2021   Procedure: COLONOSCOPY;  Surgeon: Rogene Houston, MD;  Location: [facility];  Service: Endoscopy;  Laterality: N/A;  10:30   OTHER SURGICAL HISTORY     LEFT THORACOTOMY/BTL MENSICUS TEAR REPAIRS    TUBAL LIGATION      Allergies  Allergies  Allergen Reactions   Aspartame And Phenylalanine Diarrhea and Nausea And Vomiting   Celestone [Betamethasone] Other (See Comments)    Raised  red spots on face and cheek of bottom   Ciprofloxacin Other (See Comments)    Increases heart rate   Nitrofurantoin Hives   Other Itching    HONEY - SCRATCHY THROAT   Penicillins Hives   Sulfa Antibiotics     Chest tightness    Home Medications  Prior to Admission medications   Medication Sig Start Date End Date Taking? Authorizing Provider  Acetaminophen (TYLENOL 8 HOUR PO) Take by mouth.    [provider]  ALPRAZolam Duanne Moron) 0.5 MG tablet Take 0.25 mg by mouth at bedtime as needed for anxiety (When fly). Patient not taking: Reported on 02/04/2022    [provider]  aspirin EC 81 MG tablet Take 1 tablet (81 mg total) by mouth daily. Swallow whole. 02/04/22   Loel Dubonnet, NP  atenolol (TENORMIN) 25 MG tablet Take 12.5 mg by mouth daily.    [provider]  Berberine Chloride 500 MG CAPS Take 500 mg by mouth 2 (two)  times daily.    [provider]  Beta Carotene (VITAMIN A) 25000 UNIT capsule Take 25,000 Units by mouth daily.    [provider]  BLACK CURRANT SEED OIL PO Take 1 each by mouth daily.    [provider]  calcium carbonate (OSCAL) 1500 (600 Ca) MG TABS tablet Take 1,500 mg by mouth daily.    [provider]  Calcium-Magnesium (CAL-MAG PO) Take 1 tablet by mouth daily.    [provider]  Cholecalciferol (VITAMIN D-3) 125 MCG (5000 UT) TABS Take 5,000 Units by mouth daily.    [provider]  co-enzyme Q-10 30 MG capsule Take 100 mg by mouth daily.    [provider]  ELDERBERRY PO Take 2,000 mg by mouth daily.    [provider]  FISH OIL-KRILL OIL PO Take 1 each by mouth daily.    [provider]  levothyroxine (SYNTHROID) 88 MCG tablet Take 88 mcg by mouth daily before breakfast.    [provider]  Menaquinone-7 (VITAMIN K2 PO) Take 1 tablet by mouth daily.    [provider]  Multiple Vitamins-Minerals (MULTIVITAMIN WITH MINERALS) tablet Take 1 tablet by mouth daily.    [provider]  Probiotic Product (PRO-BIOTIC BLEND PO) Take 1 each by mouth daily.    [provider]  rosuvastatin (CRESTOR) 5 MG tablet Take 1 tablet (5 mg total) by mouth 3 (three) times a week. 02/16/22   Satira Sark, MD  TURMERIC PO Take 1 each by mouth daily.    [provider]  vitamin C (ASCORBIC ACID) 500 MG tablet Take 1,000 mg by mouth daily. Chewable    [provider]  vitamin E 180 MG (400 UNITS) capsule Take 400 Units by mouth daily.    [provider]  zinc gluconate 50 MG tablet Take 50 mg by mouth daily.    [provider]    Physical Exam    Vital Signs:  Kathy Stephenson does not have vital signs available for review today.  Weight 179.6 97.1 temp 98 % oxygen sat 122/77 BP 81 pulse (before BB)   Given telephonic nature of communication,  physical exam is limited. AAOx3. NAD. Normal affect.  Speech and respirations are unlabored.  Accessory Clinical Findings    None  Assessment & Plan    1.  Preoperative Cardiovascular Risk Assessment:  Kathy Stephenson perioperative risk of a major cardiac event is 0.4% according to the Revised Cardiac Risk Index (RCRI).  Therefore, she is at low risk for perioperative complications.   Her functional capacity is good at 5.62 METs according to the [facility] Activity Status Index (DASI). Recommendations: According to ACC/AHA guidelines, no further cardiovascular testing needed.  The patient may proceed to surgery at acceptable risk.   Antiplatelet and/or Anticoagulation  Recommendations: Aspirin can be held for 7 days prior to her surgery.  Please resume Aspirin post operatively when it is felt to be safe from a bleeding standpoint.   The patient was advised that if she develops new symptoms prior to surgery to contact our office to arrange for a follow-up visit, and she verbalized understanding.   A copy of this note will be routed to requesting surgeon.  Time:   Today, I have spent 8 minutes with the patient with telehealth technology discussing medical history, symptoms, and management plan.     Elgie Collard, PA-C  06/13/2022, 8:00 AM

## 2022-12-27 NOTE — Progress Notes (Unsigned)
    Cardiology Office Note  Date: 12/28/2022   ID: Kathy Stephenson, DOB Aug 13, 1945, MRN [id number]  History of Present Illness: Kathy Stephenson is a 77 y.o. female last assessed by Ms. Vertell Novak in January, I reviewed the note.  She is here for a routine visit.  Reports no exertional chest discomfort, no palpitations.  She has been exercising at [employer] a few days a week.  I reviewed her medications.  She remains on baby aspirin and Crestor 5 mg 3 times a week.  Lipid panel back in March revealed HDL 66 and LDL 97.  She continues to follow regularly with Dr. Margo Aye for primary care.  ECG today shows normal sinus rhythm.  Physical Exam: VS:  BP 118/78   Pulse 70   Ht 5' 6.5" (1.689 m)   Wt 183 lb 6.4 oz (83.2 kg)   SpO2 98%   BMI 29.16 kg/m , BMI Body mass index is 29.16 kg/m.  Wt Readings from Last 3 Encounters:  12/28/22 183 lb 6.4 oz (83.2 kg)  02/04/22 181 lb (82.1 kg)  05/05/21 182 lb 6.4 oz (82.7 kg)    General: Patient appears comfortable at rest. HEENT: Conjunctiva and lids normal. Neck: Supple, no elevated JVP or carotid bruits. Lungs: Clear to auscultation, nonlabored breathing at rest. Cardiac: Regular rate and rhythm, no S3 or significant systolic murmur.  ECG:  An ECG dated 02/04/2022 was personally reviewed today and demonstrated:  Sinus rhythm.  Labwork:  March 2024: TSH 5.27, hemoglobin 12.6, platelets 346, BUN 15, creatinine 0.87, potassium 4.6, AST 15, ALT 11, cholesterol 181, triglycerides 103, HDL 66, LDL 97  Other Studies Reviewed Today:  No interval cardiac testing for review today.  Assessment and Plan:  1.  Coronary calcium score of 78 by CT in March 2021.  Overall relatively low risk from a cardiovascular perspective, she does not describe any angina.  ECG is normal.  Recommend continued diet and exercise plan.  She is on baby aspirin and also low-dose statin therapy.  2.  Mixed hyperlipidemia.  Currently on Crestor 5 mg 3 times a week with  last LDL 97 and HDL 66.  Continue to track lipids with Dr. Margo Aye.  She does have a history of statin intolerance at higher dose.  3.  History of palpitations.  Currently quiescent.  Disposition:  Follow up  1 year, sooner if needed.  Signed, Jonelle Sidle, M.D., F.A.C.C. [facility] at [facility]

## 2022-12-28 ENCOUNTER — Encounter: Payer: Self-pay | Admitting: Cardiology

## 2022-12-28 ENCOUNTER — Ambulatory Visit: Payer: Medicare Other | Attending: Cardiology | Admitting: Cardiology

## 2022-12-28 VITALS — BP 118/78 | HR 70 | Ht 66.5 in | Wt 183.4 lb

## 2022-12-28 DIAGNOSIS — R002 Palpitations: Secondary | ICD-10-CM | POA: Diagnosis not present

## 2022-12-28 DIAGNOSIS — R931 Abnormal findings on diagnostic imaging of heart and coronary circulation: Secondary | ICD-10-CM | POA: Insufficient documentation

## 2022-12-28 MED ORDER — ROSUVASTATIN CALCIUM 5 MG PO TABS
5.0000 mg | ORAL_TABLET | ORAL | 3 refills | Status: AC
Start: 2022-12-28 — End: ?

## 2022-12-28 NOTE — Patient Instructions (Addendum)

## 2023-02-16 ENCOUNTER — Encounter: Payer: Self-pay | Admitting: Internal Medicine

## 2023-04-05 ENCOUNTER — Other Ambulatory Visit: Payer: Self-pay

## 2023-04-05 ENCOUNTER — Encounter: Payer: Self-pay | Admitting: Allergy & Immunology

## 2023-04-05 ENCOUNTER — Ambulatory Visit (INDEPENDENT_AMBULATORY_CARE_PROVIDER_SITE_OTHER): Payer: Medicare Other | Admitting: Allergy & Immunology

## 2023-04-05 VITALS — BP 112/64 | HR 91 | Temp 98.2°F | Resp 18 | Ht 65.75 in | Wt 188.1 lb

## 2023-04-05 DIAGNOSIS — K9049 Malabsorption due to intolerance, not elsewhere classified: Secondary | ICD-10-CM

## 2023-04-05 DIAGNOSIS — J3089 Other allergic rhinitis: Secondary | ICD-10-CM

## 2023-04-05 DIAGNOSIS — J302 Other seasonal allergic rhinitis: Secondary | ICD-10-CM | POA: Diagnosis not present

## 2023-04-05 DIAGNOSIS — D721 Eosinophilia, unspecified: Secondary | ICD-10-CM | POA: Diagnosis not present

## 2023-04-05 NOTE — Progress Notes (Signed)
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 Seasonal and perennial allergic rhinitis (ragweed, trees, indoor molds, outdoor molds, dust mites, cat, dog, and cockroach) - Continue taking: Claritin (loratadine) 10mg  tablet and Flonase (fluticasone) one spray per nostril as needed.  - You can alternate antihistamines if you feel that this becomes less effective, but staying on the same one is totally fine as well.  - You seem to be doing well for the most part. - We are going to hold off on allergy shots for now since you seem to have your symptoms under control.   2. Return in about 1 year (around 04/04/2024).   Subjective:   Kathy Stephenson is a 77 y.o. female presenting today for follow up of  Chief Complaint  Patient presents with   Follow-up    Is taking claritin when around cats/dogs she uses eye drops and has some congestion.     Kathy Stephenson has a history of the following: Patient Active Problem List   Diagnosis Date Noted   Seasonal and perennial allergic rhinitis 05/05/2021   Eosinophilia 05/05/2021   Food intolerance 05/05/2021    History obtained from: chart review and patient.  Discussed the use of AI scribe software for clinical note transcription with the patient and/or guardian, who gave verbal consent to proceed.  Kathy Stephenson is a 77 y.o. female presenting for a follow up visit.  She was last seen in August 2023.  At that time, we continue with Claritin and Flonase.  We did recommend that she increase the Flonase as needed.  We also continue with  Systane eyedrops.  We held off on allergy shots since her symptoms seem to be under good control.  Since the last visit, she has done well.   [place] continues to manage her allergies with daily Claritin and Flonase. She uses a nasal spray in the morning and has not experienced any sinus infections since her last visit. She also mentions a past medical history of fibrocystic disease, for which she had biopsies in her early twenties.  Recently, she underwent knee replacements in October and February, and has since been cleared on both. However, she reports a new concern regarding a spot found on a recent mammogram. She underwent a needle biopsy earlier today and is awaiting results. She has known fibrocystic changes in her breast.   Regarding her lung health, she clarifies that she had a procedure to remove a growth from the pleura of her lung, not the entire lung. She reports good breathing and no issues with her lungs since the procedure. She has a past history of smoking for fifteen years but quit four years ago.  She has been managing her allergies well, with no recent sinus infections. She uses over-the-counter Claritin and Flonase for her allergies. She has not noticed any decrease in effectiveness of these medications.  She has been traveling frequently, visiting family in various locations. Despite her medical concerns, she reports a good quality of life and is active in her daily activities. She has cats in [place] and dogs in  the [place]. She has had multiple trips to see her brothers in [place]. She grew up [place].     Otherwise, there have been no changes to her past medical history, surgical history, family history, or social history.    Review of systems otherwise negative other than that mentioned in the HPI.    Objective:   Blood pressure 112/64, pulse 91, temperature 98.2 F (36.8 C), resp. rate 18, height 5' 5.75" (1.67 m), weight 188 lb 2 oz (85.3  kg), SpO2 95%. Body mass index is 30.6 kg/m.    Physical Exam Vitals reviewed.  Constitutional:      Appearance: She is well-developed.     Comments: Pleasant and talkative.  HENT:     Head: Normocephalic and atraumatic.     Right Ear: Tympanic membrane, ear canal and external ear normal.     Left Ear: Tympanic membrane, ear canal and external ear normal.     Nose: Rhinorrhea present. No nasal deformity, septal deviation or mucosal edema.     Right Turbinates: Enlarged, swollen and pale.     Left Turbinates: Enlarged, swollen and pale.     Right Sinus: No maxillary sinus tenderness or frontal sinus tenderness.     Left Sinus: No maxillary sinus tenderness or frontal sinus tenderness.     Comments: No nasal polyps noted.    Mouth/Throat:     Mouth: Mucous membranes are not pale and not dry.     Pharynx: Uvula midline.     Comments: Cobblestoning in the posterior oropharynx.  Eyes:     General: Lids are normal. Allergic shiner present.        Right eye: No discharge.        Left eye: No discharge.     Conjunctiva/sclera: Conjunctivae normal.     Right eye: Right conjunctiva is not injected. No chemosis.    Left eye: Left conjunctiva is not injected. No chemosis.    Pupils: Pupils are equal, round, and reactive to light.  Cardiovascular:     Rate and Rhythm: Normal rate and regular rhythm.     Heart sounds: Normal heart sounds.  Pulmonary:     Effort: Pulmonary effort is normal. No tachypnea, accessory muscle usage or respiratory distress.     Breath sounds: Normal breath sounds. No wheezing, rhonchi or rales.     Comments: Moving air well in all lung fields. No increased work of breathing noted.  Chest:     Chest wall: No tenderness.  Lymphadenopathy:     Cervical: No cervical adenopathy.  Skin:    Coloration: Skin is not pale.     Findings: No abrasion, erythema, petechiae or rash. Rash is not papular, urticarial or vesicular.  Neurological:     Mental Status: She is  alert.  Psychiatric:        Behavior: Behavior is cooperative.      Diagnostic studies: none        Malachi Bonds, MD  [facility]

## 2023-04-05 NOTE — Patient Instructions (Addendum)
1. Seasonal and perennial allergic rhinitis (ragweed, trees, indoor molds, outdoor molds, dust mites, cat, dog, and cockroach) - Continue taking: Claritin (loratadine) 10mg  tablet and Flonase (fluticasone) one spray per nostril as needed.  - You can alternate antihistamines if you feel that this becomes less effective, but staying on the same one is totally fine as well.  - You seem to be doing well for the most part. - We are going to hold off on allergy shots for now since you seem to have your symptoms under control.   2. Return in about 1 year (around 04/04/2024).    Please inform us of any Emergency Department visits, hospitalizations, or changes in symptoms. Call us before going to the ED for breathing or allergy symptoms since we might be able to fit you in for a sick visit. Feel free to contact us anytime with any questions, problems, or concerns.  It was a pleasure to see you again today!   Websites that have reliable patient information: 1. American Academy of Asthma, Allergy, and Immunology: www.aaaai.org 2. Food Allergy Research and Education (FARE): foodallergy.org 3. Mothers of Asthmatics: http://www.asthmacommunitynetwork.org 4. American College of Allergy, Asthma, and Immunology: www.acaai.org   COVID-19 Vaccine Information can be found at: PodExchange.nl For questions related to vaccine distribution or appointments, please email [email or web address] or call [phone].   We realize that you might be concerned about having an allergic reaction to the COVID19 vaccines. To help with that concern, WE ARE OFFERING THE COVID19 VACCINES IN OUR OFFICE! Ask the front desk for dates!     "Like" [place] on Facebook and Instagram for our latest updates!      A healthy democracy works best when [employer] participate! Make sure you are registered to vote! If you have moved or changed any of your contact information, you  will need to get this updated before voting!  In some cases, you MAY be able to register to vote online: [email or web address]

## 2023-08-16 ENCOUNTER — Encounter: Payer: Self-pay | Admitting: Internal Medicine

## 2023-12-26 ENCOUNTER — Ambulatory Visit: Attending: Cardiology | Admitting: Cardiology

## 2023-12-26 ENCOUNTER — Encounter: Payer: Self-pay | Admitting: Cardiology

## 2023-12-26 VITALS — BP 128/82 | HR 65 | Ht 66.5 in | Wt 184.0 lb

## 2023-12-26 DIAGNOSIS — R931 Abnormal findings on diagnostic imaging of heart and coronary circulation: Secondary | ICD-10-CM | POA: Diagnosis not present

## 2023-12-26 DIAGNOSIS — R002 Palpitations: Secondary | ICD-10-CM | POA: Insufficient documentation

## 2023-12-26 NOTE — Progress Notes (Signed)
    Cardiology Office Note  Date: 12/26/2023   ID: Kathy Stephenson, DOB 1945-06-29, MRN [id number]  History of Present Illness: Kathy Stephenson is a 78 y.o. female last seen in July 2024.  She is here for a routine visit.  Reports interval diagnosis of left breast cancer treated with lumpectomy and radiation, she is now on anastrozole and following with oncology.  Doing well overall.  She is exercising at [employer] twice a week.  Also watching her diet to keep her weight stable.  She does not report any chest pain or palpitations.  We went over her medications.  She continues on Crestor  5 mg 2 to 3 days a week.  I reviewed her most recent lipid panel.  I reviewed her ECG today which shows sinus rhythm with PAC.  Physical Exam: VS:  BP 128/82   Pulse 65   Ht 5' 6.5 (1.689 m)   Wt 184 lb (83.5 kg)   SpO2 96%   BMI 29.25 kg/m , BMI Body mass index is 29.25 kg/m.  Wt Readings from Last 3 Encounters:  12/26/23 184 lb (83.5 kg)  04/05/23 188 lb 2 oz (85.3 kg)  12/28/22 183 lb 6.4 oz (83.2 kg)    General: Patient appears comfortable at rest. HEENT: Conjunctiva and lids normal. Neck: Supple, no elevated JVP or carotid bruits. Lungs: Clear to auscultation, nonlabored breathing at rest. Cardiac: Regular rate and rhythm, no S3 or significant systolic murmur.  ECG:  An ECG dated 12/28/2022 was personally reviewed today and demonstrated:  Sinus rhythm.  Labwork:  March 2025: TSH 0.726, hemoglobin 13.4, platelets 239, BUN 17, creatinine 0.77, GFR 80, potassium 4.5, AST 16, ALT 10, cholesterol 181, triglycerides 73, HDL 68, LDL 99  Other Studies Reviewed Today:  No interval cardiac testing for review today.  Assessment and Plan:  1.  Coronary calcium  score of 78 by CT in March 2021.  She is asymptomatic with plan for risk factor reduction.  Continue regular exercise.  She is on Crestor  5 mg 2 to 3 days a week.  ECG reviewed.   2.  Mixed hyperlipidemia.  HDL 68 and LDL 99 in  March.  Continue diet and exercise, also Crestor  5 mg 2 to 3 days a week as tolerated.   3.  History of palpitations.  Currently quiescent.  Disposition:  Follow up 1 year.  Signed, Jayson JUDITHANN Sierras, M.D., F.A.C.C. [facility] at [facility]

## 2023-12-26 NOTE — Patient Instructions (Addendum)

## 2024-04-05 ENCOUNTER — Other Ambulatory Visit: Payer: Self-pay

## 2024-04-05 ENCOUNTER — Ambulatory Visit: Payer: Medicare Other | Admitting: Family Medicine

## 2024-04-05 ENCOUNTER — Encounter: Payer: Self-pay | Admitting: Family Medicine

## 2024-04-05 VITALS — BP 124/72 | HR 83 | Temp 97.9°F | Resp 18 | Ht 64.96 in | Wt 182.8 lb

## 2024-04-05 DIAGNOSIS — K9049 Malabsorption due to intolerance, not elsewhere classified: Secondary | ICD-10-CM

## 2024-04-05 DIAGNOSIS — J302 Other seasonal allergic rhinitis: Secondary | ICD-10-CM | POA: Diagnosis not present

## 2024-04-05 DIAGNOSIS — J3089 Other allergic rhinitis: Secondary | ICD-10-CM | POA: Diagnosis not present

## 2024-04-05 NOTE — Progress Notes (Signed)
 [address] [place] [address] Dept: [phone]  FOLLOW UP NOTE  Patient ID: Kathy Stephenson, female    DOB: 04-Oct-1945  Age: 78 y.o. MRN: [id number] Date of Office Visit: 04/05/2024  Assessment  Chief Complaint: Follow-up (Had breast cancer last year )  HPI Kathy Stephenson is a 78 year old female who presents to the clinic for follow-up visit.  She was last seen in this clinic on 04/05/2023 by Dr. Iva for evaluation of allergic rhinitis, food intolerance, history of eosinophilia. Her history includes malignant neoplasm of overlapping sites of left breast in female, estrogen receptor positive for which she had radiation and continues anastrozole. She continues to follow up with her oncology team at [facility].  At today's visit, she reports her allergies have been moderately well-controlled with occasional nasal congestion as the main symptom.  She continues loratadine 10 mg once a day and uses Flonase nasal sprays in addition to nasal saline rinses. Her last environmental allergy skin testing on 05/05/2021 was negative to the environmental panel.    She continues to avoid honey with no accidental ingestion or symptoms since her last visit to this clinic. She reports that she experienced throat symptoms while eating honeycomb several years ago and continues to avoid all sources of honey at this time. Her last food allergy testing was negative to the top most allergenic foods and tomato.  Her current medications are listed in the chart.   Drug Allergies:  Allergies  Allergen Reactions   Aspartame And Phenylalanine Diarrhea and Nausea And Vomiting   Celestone [Betamethasone] Other (See Comments)    Raised  red spots on face and cheek of bottom   Ciprofloxacin Other (See Comments)    Increases heart rate   Nitrofurantoin Hives   Other Itching    HONEY - SCRATCHY THROAT   Penicillins Hives   Sulfa Antibiotics      Chest tightness    Physical Exam: BP 124/72 (BP Location: Right Arm, Patient Position: Sitting, Cuff Size: Normal)   Pulse 83   Temp 97.9 F (36.6 C) (Temporal)   Resp 18   Ht 5' 4.96 (1.65 m)   Wt 182 lb 12.8 oz (82.9 kg)   SpO2 95%   BMI 30.46 kg/m    Physical Exam Vitals reviewed.  Constitutional:      Appearance: Normal appearance.  HENT:     Head: Normocephalic and atraumatic.     Right Ear: Tympanic membrane normal.     Left Ear: Tympanic membrane normal.     Nose:     Comments: Bilateral nares normal. Pharynx normal. Ears normal. Eyes normal.    Mouth/Throat:     Pharynx: Oropharynx is clear.  Eyes:     Conjunctiva/sclera: Conjunctivae normal.  Cardiovascular:     Rate and Rhythm: Normal rate and regular rhythm.     Heart sounds: Normal heart sounds. No murmur heard. Pulmonary:     Effort: Pulmonary effort is normal.     Breath sounds: Normal breath sounds.     Comments: Lungs clear to auscultation Musculoskeletal:        General: Normal range of motion.     Cervical back: Normal range of motion and neck supple.  Skin:    General: Skin is warm and dry.  Neurological:     Mental Status: She is alert and oriented to person, place, and time.  Psychiatric:        Mood and  Affect: Mood normal.        Behavior: Behavior normal.        Thought Content: Thought content normal.        Judgment: Judgment normal.     Assessment and Plan: 1. Seasonal and perennial allergic rhinitis   2. Food intolerance     Patient Instructions  Allergic rhinitis Continue allergen avoidance measures directed toward tree pollen, ragweed pollen, indoor mold, outdoor mold, dust mite, cat, dog, and cockroach as listed below Continue an antihistamine once a day if needed for runny nose or itch. Remember to rotate to a different antihistamine about every 3 months. Some examples of over the counter antihistamines include Zyrtec (cetirizine), Xyzal (levocetirizine), Allegra  (fexofenadine), and Claritin (loratidine).  Continue Flonase 1 to 2 sprays in each nostril once a day if needed for stuffy nose Consider saline nasal rinses as needed for nasal symptoms. Use this before any medicated nasal sprays for best result  Food intolerance Continue to avoid honey.  In case of an allergic reaction, take loratadine 10 mg once every 12-24 hours and call the clinic  Call the clinic if this treatment plan is not working well for you.  Follow up in 12 months or sooner if needed.   Return in about 1 year (around 04/05/2025), or if symptoms worsen or fail to improve.    Thank you for the opportunity to care for this patient.  Please do not hesitate to contact me with questions.  Arlean Mutter, FNP [facility] 

## 2024-04-05 NOTE — Patient Instructions (Addendum)
 Allergic rhinitis Continue allergen avoidance measures directed toward tree pollen, ragweed pollen, indoor mold, outdoor mold, dust mite, cat, dog, and cockroach as listed below Continue an antihistamine once a day if needed for runny nose or itch. Remember to rotate to a different antihistamine about every 3 months. Some examples of over the counter antihistamines include Zyrtec (cetirizine), Xyzal (levocetirizine), Allegra (fexofenadine), and Claritin (loratidine).  Continue Flonase 1 to 2 sprays in each nostril once a day if needed for stuffy nose Consider saline nasal rinses as needed for nasal symptoms. Use this before any medicated nasal sprays for best result  Food intolerance Continue to avoid honey.  In case of an allergic reaction, take loratadine 10 mg once every 12-24 hours and call the clinic  Call the clinic if this treatment plan is not working well for you.  Follow up in 12 months or sooner if needed.  Reducing Pollen Exposure The American Academy of Allergy, Asthma and Immunology suggests the following steps to reduce your exposure to pollen during allergy seasons. Do not hang sheets or clothing out to dry; pollen may collect on these items. Do not mow lawns or spend time around freshly cut grass; mowing stirs up pollen. Keep windows closed at night.  Keep car windows closed while driving. Minimize morning activities outdoors, a time when pollen counts are usually at their highest. Stay indoors as much as possible when pollen counts or humidity is high and on windy days when pollen tends to remain in the air longer. Use air conditioning when possible.  Many air conditioners have filters that trap the pollen spores. Use a HEPA room air filter to remove pollen form the indoor air you breathe.    Control of Mold Allergen Mold and fungi can grow on a variety of surfaces provided certain temperature and moisture conditions exist.  Outdoor molds grow on plants, decaying  vegetation and soil.  The major outdoor mold, Alternaria and Cladosporium, are found in very high numbers during hot and dry conditions.  Generally, a late Summer - Fall peak is seen for common outdoor fungal spores.  Rain will temporarily lower outdoor mold spore count, but counts rise rapidly when the rainy period ends.  The most important indoor molds are Aspergillus and Penicillium.  Dark, humid and poorly ventilated basements are ideal sites for mold growth.  The next most common sites of mold growth are the bathroom and the kitchen.  Outdoor [employer] Use air conditioning and keep windows closed Avoid exposure to decaying vegetation. Avoid leaf raking. Avoid grain handling. Consider wearing a face mask if working in moldy areas.  Indoor Mold Control Maintain humidity below 50%. Clean washable surfaces with 5% bleach solution. Remove sources e.g. Contaminated carpets.  Control of Dust Mite Allergen Dust mites play a major role in allergic asthma and rhinitis. They occur in environments with high humidity wherever human skin is found. Dust mites absorb humidity from the atmosphere (ie, they do not drink) and feed on organic matter (including shed human and animal skin). Dust mites are a microscopic type of insect that you cannot see with the naked eye. High levels of dust mites have been detected from mattresses, pillows, carpets, upholstered furniture, bed covers, clothes, soft toys and any woven material. The principal allergen of the dust mite is found in its feces. A gram of dust may contain 1,000 mites and 250,000 fecal particles. Mite antigen is easily measured in the air during house cleaning activities. Dust mites do not bite  and do not cause harm to humans, other than by triggering allergies/asthma.  Ways to decrease your exposure to dust mites in your home:  1. Encase mattresses, box springs and pillows with a mite-impermeable barrier or cover 2. Wash sheets, blankets and drapes  weekly in hot water (130 F) with detergent and dry them in a dryer on the hot setting. 3. Have the room cleaned frequently with a vacuum cleaner and a damp dust-mop. For carpeting or rugs, vacuuming with a vacuum cleaner equipped with a high-efficiency particulate air (HEPA) filter. The dust mite allergic individual should not be in a room which is being cleaned and should wait 1 hour after cleaning before going into the room. 4. Do not sleep on upholstered furniture (eg, couches). 5. If possible removing carpeting, upholstered furniture and drapery from the home is ideal. Horizontal blinds should be eliminated in the rooms where the person spends the most time (bedroom, study, television room). Washable vinyl, roller-type shades are optimal. 6. Remove all non-washable stuffed toys from the bedroom. Wash stuffed toys weekly like sheets and blankets above. 7. Reduce indoor humidity to less than 50%. Inexpensive humidity monitors can be purchased at most hardware stores. Do not use a humidifier as can make the problem worse and are not recommended.  Control of Dog or Cat Allergen Avoidance is the best way to manage a dog or cat allergy. If you have a dog or cat and are allergic to dog or cats, consider removing the dog or cat from the home. If you have a dog or cat but don't want to find it a new home, or if your family wants a pet even though someone in the household is allergic, here are some strategies that may help keep symptoms at bay:  Keep the pet out of your bedroom and restrict it to only a few rooms. Be advised that keeping the dog or cat in only one room will not limit the allergens to that room. Don't pet, hug or kiss the dog or cat; if you do, wash your hands with soap and water. High-efficiency particulate air (HEPA) cleaners run continuously in a bedroom or living room can reduce allergen levels over time. Regular use of a high-efficiency vacuum cleaner or a central vacuum can reduce  allergen levels. Giving your dog or cat a bath at least once a week can reduce airborne allergen.  Control of Cockroach Allergen Cockroach allergen has been identified as an important cause of acute attacks of asthma, especially in urban settings.  There are fifty-five species of cockroach that exist in the [place] , however only three, the American, German and Oriental species produce allergen that can affect patients with Asthma.  Allergens can be obtained from fecal particles, egg casings and secretions from cockroaches.    Remove food sources. Reduce access to water. Seal access and entry points. Spray runways with 0.5-1% Diazinon or Chlorpyrifos Blow boric acid power under stoves and refrigerator. Place bait stations (hydramethylnon) at feeding sites.

## 2025-04-09 ENCOUNTER — Ambulatory Visit: Admitting: Allergy & Immunology
